# Patient Record
Sex: Female | Born: 1970 | Race: White | Hispanic: No | Marital: Married | Smoking: Current every day smoker
Health system: Southern US, Community
[De-identification: ages and names within clinical notes are randomized; demographics above are authoritative.]

## PROBLEM LIST (undated history)

## (undated) DIAGNOSIS — H3321 Serous retinal detachment, right eye: Secondary | ICD-10-CM

## (undated) DIAGNOSIS — I10 Essential (primary) hypertension: Secondary | ICD-10-CM

## (undated) HISTORY — PX: EYE SURGERY: SHX253

---

## 2004-08-04 ENCOUNTER — Emergency Department (HOSPITAL_COMMUNITY): Admission: EM | Admit: 2004-08-04 | Discharge: 2004-08-04 | Payer: Self-pay | Admitting: Emergency Medicine

## 2004-09-02 ENCOUNTER — Emergency Department (HOSPITAL_COMMUNITY): Admission: EM | Admit: 2004-09-02 | Discharge: 2004-09-02 | Payer: Self-pay | Admitting: Emergency Medicine

## 2004-09-03 ENCOUNTER — Emergency Department (HOSPITAL_COMMUNITY): Admission: EM | Admit: 2004-09-03 | Discharge: 2004-09-03 | Payer: Self-pay | Admitting: Emergency Medicine

## 2004-09-05 ENCOUNTER — Emergency Department (HOSPITAL_COMMUNITY): Admission: EM | Admit: 2004-09-05 | Discharge: 2004-09-05 | Payer: Self-pay | Admitting: Family Medicine

## 2004-12-08 ENCOUNTER — Emergency Department (HOSPITAL_COMMUNITY): Admission: EM | Admit: 2004-12-08 | Discharge: 2004-12-08 | Payer: Self-pay | Admitting: Emergency Medicine

## 2005-02-23 ENCOUNTER — Emergency Department (HOSPITAL_COMMUNITY): Admission: EM | Admit: 2005-02-23 | Discharge: 2005-02-24 | Payer: Self-pay | Admitting: Emergency Medicine

## 2005-02-24 ENCOUNTER — Emergency Department (HOSPITAL_COMMUNITY): Admission: EM | Admit: 2005-02-24 | Discharge: 2005-02-24 | Payer: Self-pay | Admitting: Emergency Medicine

## 2005-04-03 ENCOUNTER — Emergency Department (HOSPITAL_COMMUNITY): Admission: EM | Admit: 2005-04-03 | Discharge: 2005-04-03 | Payer: Self-pay | Admitting: Emergency Medicine

## 2005-04-04 ENCOUNTER — Emergency Department (HOSPITAL_COMMUNITY): Admission: EM | Admit: 2005-04-04 | Discharge: 2005-04-04 | Payer: Self-pay | Admitting: Emergency Medicine

## 2005-05-05 ENCOUNTER — Inpatient Hospital Stay (HOSPITAL_COMMUNITY): Admission: AD | Admit: 2005-05-05 | Discharge: 2005-05-06 | Payer: Self-pay | Admitting: Obstetrics and Gynecology

## 2005-07-12 ENCOUNTER — Emergency Department (HOSPITAL_COMMUNITY): Admission: EM | Admit: 2005-07-12 | Discharge: 2005-07-12 | Payer: Self-pay | Admitting: Emergency Medicine

## 2005-08-09 ENCOUNTER — Emergency Department (HOSPITAL_COMMUNITY): Admission: EM | Admit: 2005-08-09 | Discharge: 2005-08-09 | Payer: Self-pay | Admitting: *Deleted

## 2005-08-10 ENCOUNTER — Emergency Department (HOSPITAL_COMMUNITY): Admission: EM | Admit: 2005-08-10 | Discharge: 2005-08-10 | Payer: Self-pay | Admitting: Emergency Medicine

## 2005-08-13 ENCOUNTER — Inpatient Hospital Stay (HOSPITAL_COMMUNITY): Admission: AD | Admit: 2005-08-13 | Discharge: 2005-08-13 | Payer: Self-pay | Admitting: Obstetrics & Gynecology

## 2005-08-31 ENCOUNTER — Emergency Department (HOSPITAL_COMMUNITY): Admission: EM | Admit: 2005-08-31 | Discharge: 2005-08-31 | Payer: Self-pay | Admitting: Emergency Medicine

## 2005-09-08 ENCOUNTER — Emergency Department (HOSPITAL_COMMUNITY): Admission: EM | Admit: 2005-09-08 | Discharge: 2005-09-08 | Payer: Self-pay | Admitting: Emergency Medicine

## 2005-09-14 ENCOUNTER — Emergency Department (HOSPITAL_COMMUNITY): Admission: EM | Admit: 2005-09-14 | Discharge: 2005-09-14 | Payer: Self-pay | Admitting: Emergency Medicine

## 2006-02-02 ENCOUNTER — Emergency Department (HOSPITAL_COMMUNITY): Admission: EM | Admit: 2006-02-02 | Discharge: 2006-02-02 | Payer: Self-pay | Admitting: Emergency Medicine

## 2006-03-21 ENCOUNTER — Emergency Department (HOSPITAL_COMMUNITY): Admission: EM | Admit: 2006-03-21 | Discharge: 2006-03-22 | Payer: Self-pay | Admitting: Emergency Medicine

## 2006-07-12 ENCOUNTER — Emergency Department (HOSPITAL_COMMUNITY): Admission: EM | Admit: 2006-07-12 | Discharge: 2006-07-12 | Payer: Self-pay | Admitting: Emergency Medicine

## 2006-12-02 ENCOUNTER — Emergency Department (HOSPITAL_COMMUNITY): Admission: EM | Admit: 2006-12-02 | Discharge: 2006-12-03 | Payer: Self-pay | Admitting: Emergency Medicine

## 2007-06-24 IMAGING — CR DG CHEST 2V
2 series · 2 of 2 positions shown · non-contrast
Comparison: 08/31/05.
COMPARISON: None.

CLINICAL DATA: Right chest pain and shortness of breath, post trauma. 
 CHEST ? 2 VIEW:

[w chest pa]
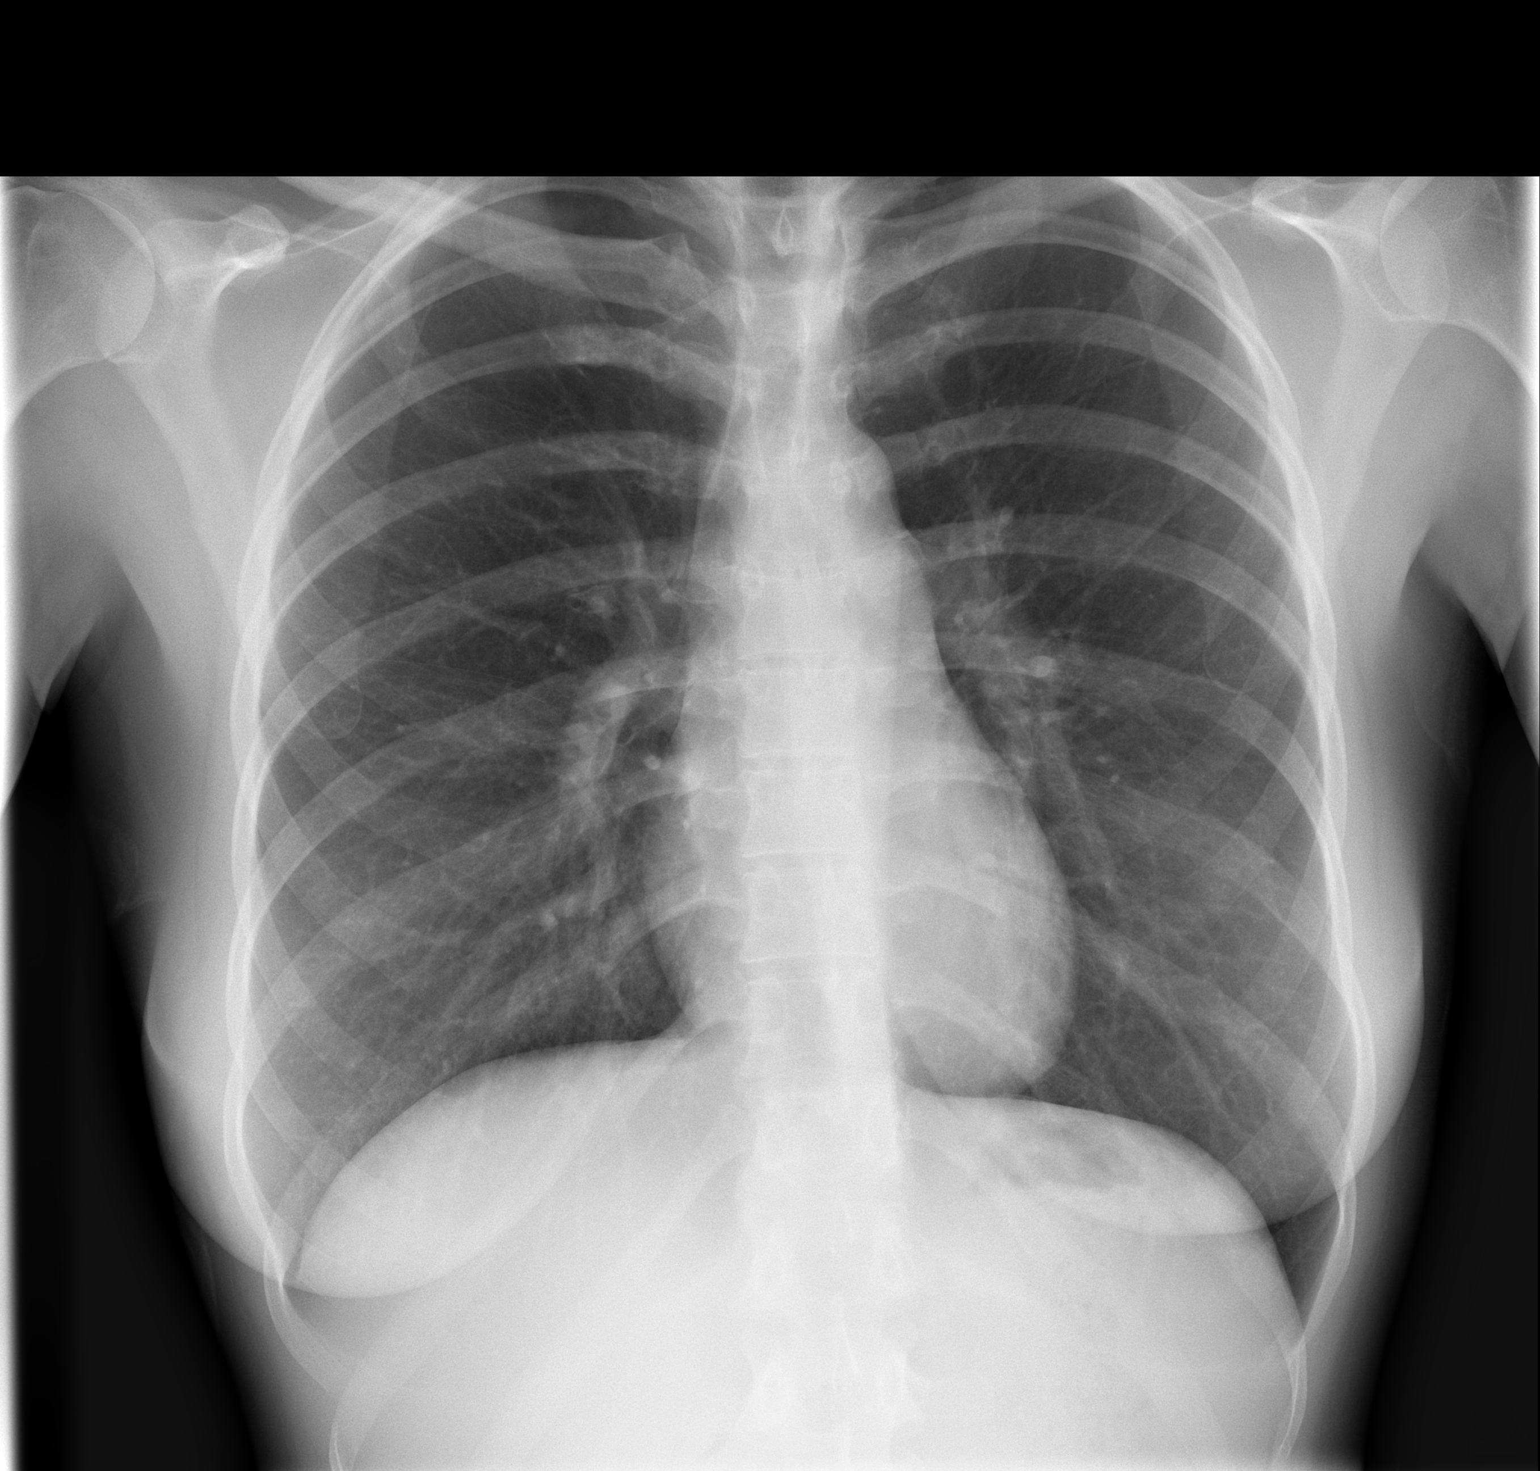

[w chest lat]
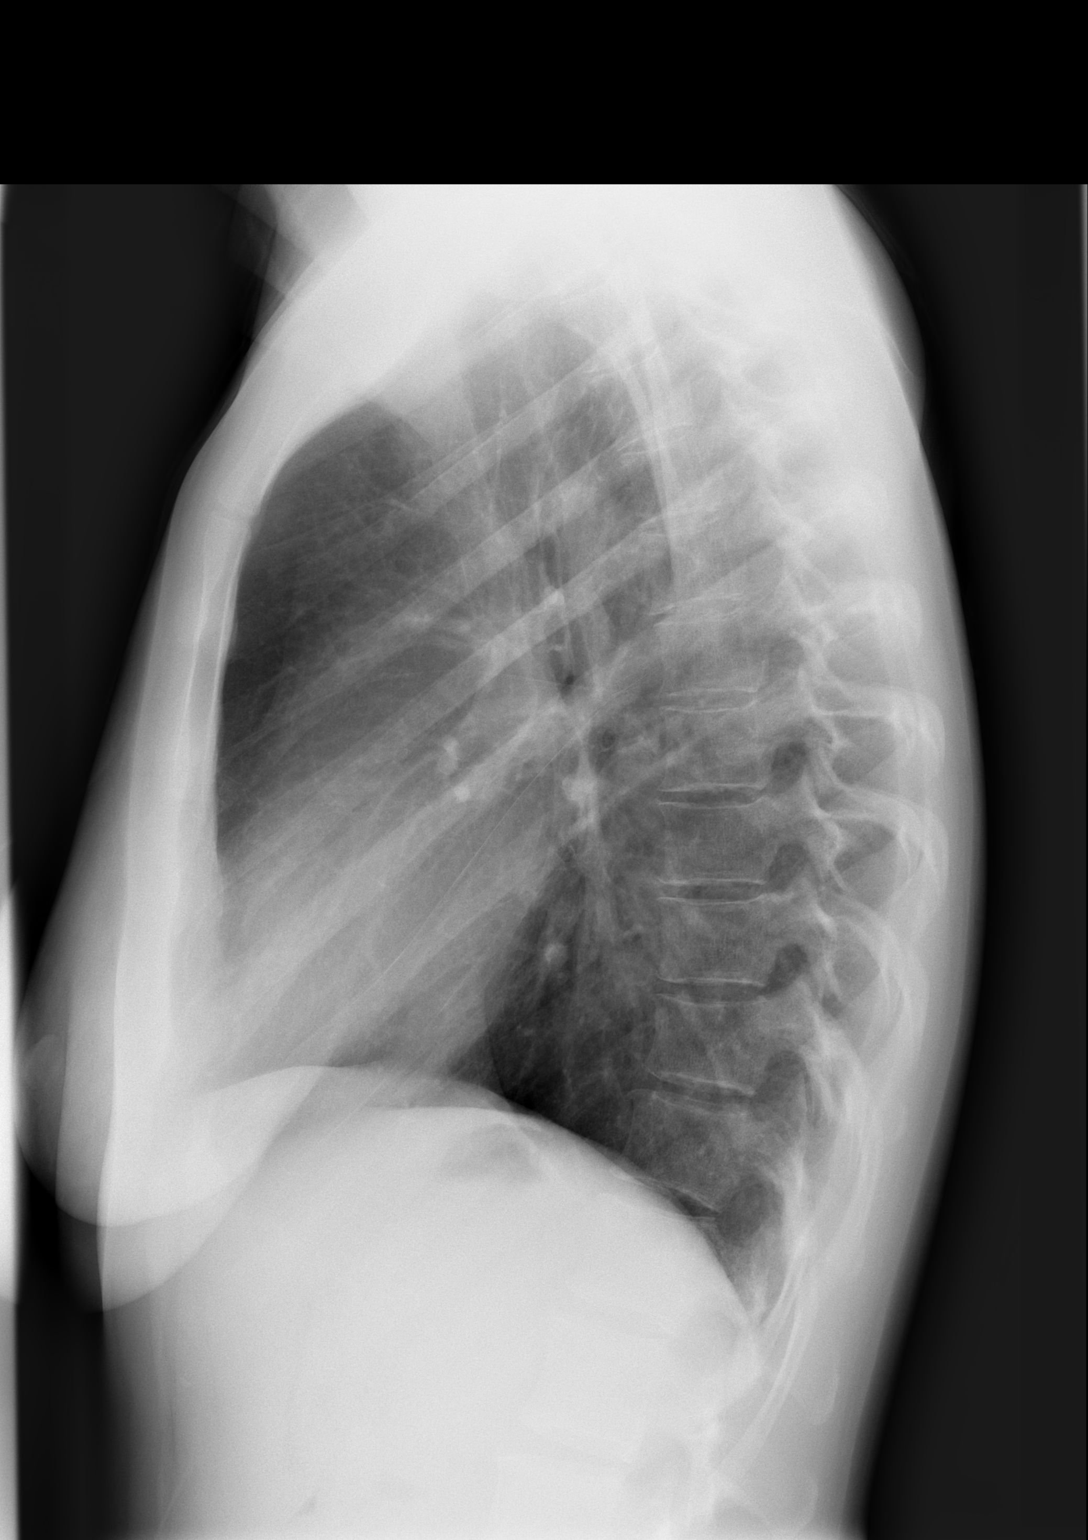

[2 of 2 positions shown; findings below may reference images not displayed]

FINDINGS: The heart size and mediastinal contours are within normal limits.  Both lungs are clear.  The visualized skeletal structures are unremarkable.
IMPRESSION: No active cardiopulmonary disease.
 RIGHT RIBS ? 2 VIEW:
FINDINGS: A BB was placed at the site of symptoms.  No rib fractures.  No pneumothorax or hemothorax.
IMPRESSION: No acute findings.

## 2007-06-24 IMAGING — CR DG RIBS 2V*R*
2 series · 2 of 2 positions shown · non-contrast
Comparison: 08/31/05.
COMPARISON: None.

CLINICAL DATA: Right chest pain and shortness of breath, post trauma. 
 CHEST ? 2 VIEW:

[w ribs ap/pa lower right]
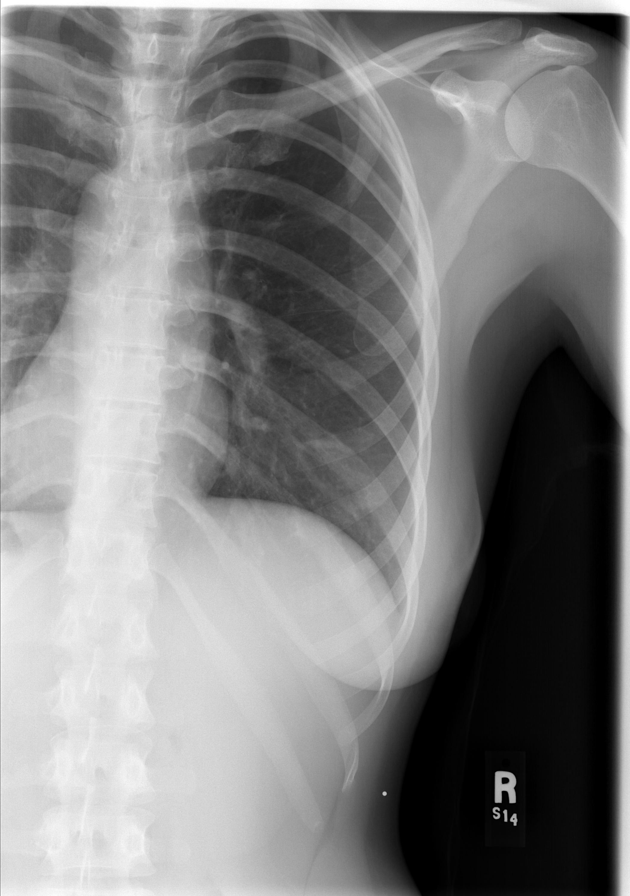

[w ribs oblique right]
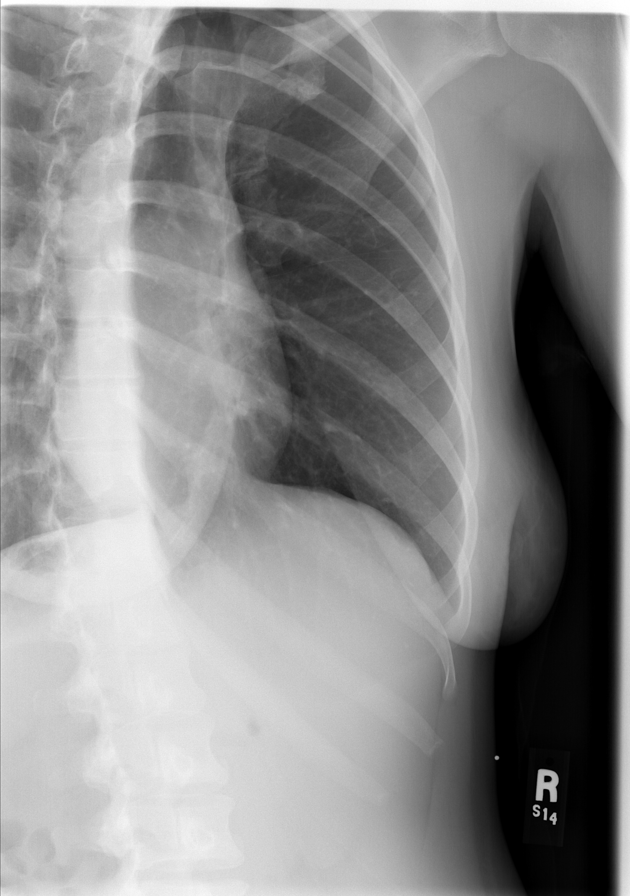

[2 of 2 positions shown; findings below may reference images not displayed]

FINDINGS: The heart size and mediastinal contours are within normal limits.  Both lungs are clear.  The visualized skeletal structures are unremarkable.
IMPRESSION: No active cardiopulmonary disease.
 RIGHT RIBS ? 2 VIEW:
FINDINGS: A BB was placed at the site of symptoms.  No rib fractures.  No pneumothorax or hemothorax.
IMPRESSION: No acute findings.

## 2007-07-25 ENCOUNTER — Ambulatory Visit (HOSPITAL_COMMUNITY): Admission: RE | Admit: 2007-07-25 | Discharge: 2007-07-25 | Payer: Self-pay | Admitting: Ophthalmology

## 2007-08-15 ENCOUNTER — Ambulatory Visit (HOSPITAL_COMMUNITY): Admission: RE | Admit: 2007-08-15 | Discharge: 2007-08-15 | Payer: Self-pay | Admitting: Family Medicine

## 2008-01-16 ENCOUNTER — Ambulatory Visit (HOSPITAL_COMMUNITY): Admission: RE | Admit: 2008-01-16 | Discharge: 2008-01-16 | Payer: Self-pay | Admitting: Ophthalmology

## 2009-03-25 ENCOUNTER — Ambulatory Visit (HOSPITAL_COMMUNITY): Admission: RE | Admit: 2009-03-25 | Discharge: 2009-03-25 | Payer: Self-pay | Admitting: Ophthalmology

## 2009-07-22 ENCOUNTER — Emergency Department (HOSPITAL_COMMUNITY): Admission: EM | Admit: 2009-07-22 | Discharge: 2009-07-23 | Payer: Self-pay | Admitting: Emergency Medicine

## 2010-02-23 ENCOUNTER — Encounter: Payer: Self-pay | Admitting: Obstetrics and Gynecology

## 2010-04-20 LAB — DIFFERENTIAL
Basophils Relative: 0 % (ref 0–1)
Eosinophils Absolute: 0.1 10*3/uL (ref 0.0–0.7)
Lymphocytes Relative: 26 % (ref 12–46)
Monocytes Relative: 8 % (ref 3–12)
Neutrophils Relative %: 65 % (ref 43–77)

## 2010-04-20 LAB — CBC
MCHC: 34.7 g/dL (ref 30.0–36.0)
Platelets: 252 10*3/uL (ref 150–400)
RBC: 4.22 MIL/uL (ref 3.87–5.11)
RDW: 12.4 % (ref 11.5–15.5)

## 2010-04-20 LAB — BASIC METABOLIC PANEL
BUN: 10 mg/dL (ref 6–23)
Calcium: 9.3 mg/dL (ref 8.4–10.5)
Chloride: 101 mEq/L (ref 96–112)

## 2010-04-23 LAB — CBC
HCT: 40.7 % (ref 36.0–46.0)
MCHC: 35.1 g/dL (ref 30.0–36.0)
MCV: 92.4 fL (ref 78.0–100.0)
RDW: 12.8 % (ref 11.5–15.5)

## 2010-04-23 LAB — BASIC METABOLIC PANEL
BUN: 15 mg/dL (ref 6–23)
Calcium: 9.4 mg/dL (ref 8.4–10.5)
Chloride: 103 mEq/L (ref 96–112)
GFR calc Af Amer: >60 mL/min (ref >60)
Glucose, Bld: 88 mg/dL (ref 70–99)
Potassium: 4 mEq/L (ref 3.5–5.1)

## 2010-06-17 NOTE — Op Note (Signed)
Greer, Julie                ACCOUNT NO.:  [id number]   MEDICAL RECORD NO.:  [id number]          PATIENT TYPE:  AMB   LOCATION:  SDS                          FACILITY:  [facility]   PHYSICIAN:  Alford Highland. Rankin, M.D.   DATE OF BIRTH:  [birth year]   DATE OF PROCEDURE:  01/16/2008  DATE OF DISCHARGE:  01/16/2008                               OPERATIVE REPORT   PREOPERATIVE DIAGNOSES:  1. 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  2. Retained oil - nonmagnetic foreign body, right eye.   POSTOPERATIVE DIAGNOSES:  1. 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  2. Retained oil - nonmagnetic foreign body, right eye.   PROCEDURE:  1. Posterior vitrectomy with endolaser panphotocoagulation for retinal      detachment.  2. Removal of nonmagnetic foreign body, right eye.   SURGEON:  Fawn Kirk, MD.   ANESTHESIA:  Local as per my anesthesia control.   INDICATIONS FOR PROCEDURE:  The patient is a 40 year old woman who has  suffered profound vision loss on the basis of chronic retinal  detachment, previously matured cataract which required a complex retinal  detachment repair which was successfully maintained in its retinal  orientated state, and now has retained oil - nonmagnetic foreign body in  the right eye which requires removal.  The patient understands this is  an attempt to maintain retinal re-attachment but also to begin the  process of visual acuity restoration and repair.  The patient  understands the risk of anesthesia including the  rare occurrence of  death, loss of the eye including but not limited to, hemorrhage,  infection, scarring, need for further surgery, change in vision, loss of  vision, progressive disease despite  intervention.   PROCEDURE IN DETAIL:  After appropriate signed consent was obtained, the  patient was taken to the operating room.  In the operating room,  appropriate monitors followed by mild sedation.  Xylocaine 2% 4 mL  injected retrobulbar with an additional 5 mL just directed temporally in  the modified [employer] fashion for superficial anesthesia.  The right  periocular region was sterilely prepped and draped in the usual sterile  fashion.  Lid speculum was applied.  A 25-gauge trocar was placed in the  inferotemporal quadrant.  Infusion was then turned on.  Some small  loculations of oil were found subconjunctival in the inferotemporal as  well as superonasal quadrant.  These were opened with MVR blade and  these were expressed with scleral depression and are removed from the  subconjunctival space without difficulty.   At this time, the superior nasal trocar applied.  Conjunctiva fashioned  supratemporally in limited fashion.  MVR blade used to enter the  vitreous cavity.  At this time, the extraction of silicone wall was then  carried out using an extraction  set with an 18-gauge Silastic catheter.  The oil was removed without difficulty and there were no complications.  At this time, fluid-air exchange carried out on a couple of occasions so  as to remove the smaller bubbles of silicone oil which were retained.  Vitrectomy instrumentation was used for this portion of the technique  and the anterior chamber was also irrigated free of small silicone oil  bubbles.  At this time, the infusion pressure was run up and the trocar  removed.  The supratemporal sclerotomy was then closed with 7-0 Vicryl  sutures.  The infusion pressure was run down and a modest intraocular  pressure elevation was maintained in place because of the typical  hypotony which occurs postoperatively.  The wounds were secured and  subconjunctival Decadron applied.  Sterile patch and Fox shield were  applied.   The patient tolerated the procedure well without any  complication, taken to the PACU.      Alford Highland Rankin, M.D.  Electronically Signed     GAR/MEDQ  D:  01/16/2008  T:  01/17/2008  Job:  [id number]

## 2010-06-17 NOTE — Op Note (Signed)
[name], [name]                ACCOUNT NO.:  [id number]   MEDICAL RECORD NO.:  [id number]          PATIENT TYPE:  AMB   LOCATION:  SDS                          FACILITY:  [facility]   PHYSICIAN:  Alford Highland. Rankin, M.D.   DATE OF BIRTH:  02-24-1970   DATE OF PROCEDURE:  08/15/2007  DATE OF DISCHARGE:                               OPERATIVE REPORT   PREOPERATIVE DIAGNOSES:  1. Proliferative vitreoretinopathy - stage D, open funnel detachment.  2. Tractional detachment, right eye.  3. Combined rhegmatogenous detachment, right eye.  4. 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  5. Cataract.   POSTOPERATIVE DIAGNOSES:  1. Proliferative vitreoretinopathy - stage D, open funnel detachment.  2. Tractional detachment, right eye.  3. Combined rhegmatogenous detachment, right eye.  4. 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  5. Cataract.   PROCEDURE:  1. Repair of complex retinal detachment of the right eye via      vitrectomy with membrane peel - 20 gauge.  We are using endolaser      pan-photocoagulation and retinopexy, as well as scleral buckle      using 240 and 70 elements in addition to pars plana lensectomy,      inferior peripheral iridectomy, and injection of vitreous      substitute - permanent silicone oil 5000 Centistokes.  2. Posterior synechialysis, right eye.   SURGEON:  Alford Highland. Rankin, MD   ANESTHESIA:  General endotracheal anesthesia.   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  She understands she has a total retinal detachment.  She understands the need for the retina to be reattached and the major  surgical intervention required including removal of the cataract so as  to allow for anterior vitreous base dissection,  vitreous skirt trimming.  She understands the risks of anesthesia including occurrence of death,  loss of the eye, including from the underlying condition as well as  surgical repair, including, but not limited to hemorrhage, infection,  scarring, need for further surgery, change in vision, loss of vision,  and progressive disease despite intervention.   PROCEDURE IN DETAIL:  Appropriate signed consent was obtained.  The  patient was taken to the operating room.  In the operating room,  appropriate monitors were followed by mild sedation.  General  endotracheal anesthesia was induced without difficulty.  The right  periocular region was sterilely prepped and draped in the usual sterile  fashion.  Lid speculum was applied.  Conjunctiva pretty much advanced to  360 degrees.  Rectus muscles isolated on 2-0 silk ties.  Incision was  made to place a 240 band to help isolate the vitreous base.  This was  placed and this was secured permanently in each quadrant with 5-0  Mersilene mattress sutures.  This time, the infusion was then secured  3.5 mm posterior to limbus.  Placement of the  vitreous cavity verified  visually.  Superior sclerotomies were then fashioned.  Microscope was  placed in position.  Core vitrectomy was then begun with a BIOM  Visualization System.  Notable findings were of a narrow fundal  detachment posteriorly.  Notable findings of the subretinal bands  peripherally as well as later on one at 2 o'clock straddling the  equator, and also another one at the 6:30 position also near the  equator, but none of these had traction-related issues nor prevented the  retinal reattachment.  This time, it is necessary to perform lensectomy.  Posterior capsule was entered.  The lens was aspirated using vitrectomy  instrumentation alone.  The anterior capsule was then grasped with  forceps and this was removed as well.  Scleral depression was then used  to trim the vitreous base.  This  time, a retinotomy was then fashioned  inferonasal to the optic nerve.  Fluid-fluid exchange carried out and  thick subretinal fluid was removed in this fashion.  Thereafter, as  necessary created inferior-peripheral iridectomy for placement of plain  silicone oil.  This was done without difficulty with surgical  instrumentation.  This time, fluid exchange completed.  All preretinal  and subretinal fluid was removed in this fashion.  Reaccumulated fluid  was removed.  Endolaser photocoagulation placed 360 degrees from the ora  serrata straddling over with the buckle, which had been preplaced and  slightly posterior.  This was carried out without difficulty.  The  retina remained nicely attached.  Reaccumulated fluid was removed.  Retinotomy was then secured with laser retinopexy as well.  This time,  one of the sclerotomy superiorly was closed with 7-0 Vicryl.  A passive  injection of silicone oil was then carried out.  The remaining  sclerotomies were then removed, the infusion removed, and closed as well  with 7-0 Vicryl.  Tenons were copiously irrigated to remove the  silicone.  Conjunctiva was then brought forward and closed in layers to  the limbus and superiorly with 7-0 Vicryl suture.  Bug juice was then  used to irrigate the subconjunctival and the sub-Tenon space.  Subconjunctival Decadron applied.  Sterile patch and Fox shield applied.  The patient tolerated the procedure without complication.      Alford Highland Rankin, M.D.  Electronically Signed     GAR/MEDQ  D:  08/15/2007  T:  08/16/2007  Job:  [id number]   cc:   Susanne Greenhouse, MD

## 2010-10-30 LAB — BASIC METABOLIC PANEL
BUN: 14
CO2: 25
Calcium: 8.9
Chloride: 102
Creatinine, Ser: 0.87
GFR calc Af Amer: >60
GFR calc non Af Amer: >60
Glucose, Bld: 94
Potassium: 3.7
Sodium: 136

## 2010-10-30 LAB — DIFFERENTIAL
Basophils Absolute: 0
Eosinophils Absolute: 0.1
Eosinophils Relative: 1

## 2010-10-30 LAB — SEDIMENTATION RATE: Sed Rate: 13

## 2010-10-30 LAB — CBC
HCT: 41.3
HCT: 39.6
Hemoglobin: 13.7
MCHC: 34.4
MCHC: 34.6
MCV: 87.7
MCV: 87.5
Platelets: 261
RBC: 4.52
RDW: 11.9
WBC: 8.8

## 2010-10-30 LAB — ANA: Anti Nuclear Antibody(ANA): NEGATIVE

## 2010-10-30 LAB — RPR: RPR Ser Ql: NONREACTIVE

## 2010-10-30 LAB — RHEUMATOID FACTOR: Rhuematoid fact SerPl-aCnc: <20

## 2010-11-07 LAB — BASIC METABOLIC PANEL
BUN: 9 mg/dL (ref 6–23)
GFR calc Af Amer: >60 mL/min (ref >60)
Glucose, Bld: 93 mg/dL (ref 70–99)
Sodium: 135 mEq/L (ref 135–145)

## 2010-11-07 LAB — CBC
HCT: 38.5 % (ref 36.0–46.0)
MCHC: 34.2 g/dL (ref 30.0–36.0)
MCV: 89.4 fL (ref 78.0–100.0)
Platelets: 207 10*3/uL (ref 150–400)
RBC: 4.31 MIL/uL (ref 3.87–5.11)
WBC: 7.1 10*3/uL (ref 4.0–10.5)

## 2010-11-12 LAB — I-STAT 8, (EC8 V) (CONVERTED LAB)
Acid-base deficit: 1
BUN: 5 — ABNORMAL LOW
Bicarbonate: 25.1 — ABNORMAL HIGH
Chloride: 107
Glucose, Bld: 80
HCT: 34 — ABNORMAL LOW
Hemoglobin: 11.6 — ABNORMAL LOW
Operator id: 275321
Potassium: 3.8
Sodium: 137
TCO2: 26
pCO2, Ven: 44.3 — ABNORMAL LOW
pH, Ven: 7.361 — ABNORMAL HIGH

## 2010-11-12 LAB — URINALYSIS, ROUTINE W REFLEX MICROSCOPIC
Bilirubin Urine: NEGATIVE
Glucose, UA: NEGATIVE
Ketones, ur: NEGATIVE
Protein, ur: NEGATIVE
pH: 7

## 2010-11-12 LAB — URINE MICROSCOPIC-ADD ON

## 2010-11-12 LAB — DIFFERENTIAL
Basophils Absolute: 0
Basophils Relative: 0
Eosinophils Absolute: 0.1
Eosinophils Relative: 1
Lymphocytes Relative: 21
Lymphs Abs: 3
Monocytes Absolute: 1.1 — ABNORMAL HIGH
Monocytes Relative: 8
Neutro Abs: 10.2 — ABNORMAL HIGH
Neutrophils Relative %: 71

## 2010-11-12 LAB — TYPE AND SCREEN
ABO/RH(D): A POS
Antibody Screen: NEGATIVE

## 2010-11-12 LAB — CBC
HCT: 32 — ABNORMAL LOW
Hemoglobin: 11 — ABNORMAL LOW
MCHC: 34.5
MCV: 90.4
Platelets: 288
RBC: 3.55 — ABNORMAL LOW
RDW: 13.8
WBC: 14.5 — ABNORMAL HIGH

## 2010-11-12 LAB — POCT I-STAT CREATININE
Creatinine, Ser: 0.6
Operator id: 275321

## 2010-11-12 LAB — WET PREP, GENITAL
Clue Cells Wet Prep HPF POC: NONE SEEN
Yeast Wet Prep HPF POC: NONE SEEN

## 2010-11-12 LAB — GC/CHLAMYDIA PROBE AMP, GENITAL
Chlamydia, DNA Probe: POSITIVE — AB
GC Probe Amp, Genital: NEGATIVE

## 2010-11-12 LAB — ABO/RH: ABO/RH(D): A POS

## 2010-11-12 LAB — HCG, QUANTITATIVE, PREGNANCY: hCG, Beta Chain, Quant, S: 14148 — ABNORMAL HIGH

## 2010-11-20 LAB — POCT PREGNANCY, URINE: Preg Test, Ur: POSITIVE

## 2011-08-31 ENCOUNTER — Emergency Department (HOSPITAL_COMMUNITY)
Admission: EM | Admit: 2011-08-31 | Discharge: 2011-08-31 | Disposition: A | Discharge status: Home / Self Care | Payer: Medicaid Other | Attending: Emergency Medicine | Admitting: Emergency Medicine

## 2011-08-31 ENCOUNTER — Encounter (HOSPITAL_COMMUNITY): Payer: Self-pay | Admitting: Emergency Medicine

## 2011-08-31 ENCOUNTER — Emergency Department (HOSPITAL_COMMUNITY): Payer: Medicaid Other

## 2011-08-31 DIAGNOSIS — M25532 Pain in left wrist: Secondary | ICD-10-CM

## 2011-08-31 DIAGNOSIS — F17200 Nicotine dependence, unspecified, uncomplicated: Secondary | ICD-10-CM | POA: Insufficient documentation

## 2011-08-31 DIAGNOSIS — M25539 Pain in unspecified wrist: Secondary | ICD-10-CM | POA: Insufficient documentation

## 2011-08-31 DIAGNOSIS — I10 Essential (primary) hypertension: Secondary | ICD-10-CM | POA: Insufficient documentation

## 2011-08-31 HISTORY — DX: Essential (primary) hypertension: I10

## 2011-08-31 MED ORDER — IBUPROFEN 800 MG PO TABS
800.0000 mg | ORAL_TABLET | Freq: Once | ORAL | Status: AC
  Administered 2011-08-31: 800 mg via ORAL
  Filled 2011-08-31: qty 1

## 2011-08-31 MED ORDER — OXYCODONE-ACETAMINOPHEN 5-325 MG PO TABS: 1.0000 | ORAL_TABLET | ORAL | Status: AC | PRN

## 2011-08-31 MED ORDER — OXYCODONE-ACETAMINOPHEN 5-325 MG PO TABS
1.0000 | ORAL_TABLET | Freq: Once | ORAL | Status: AC
  Administered 2011-08-31: 1 via ORAL
  Filled 2011-08-31: qty 1

## 2011-08-31 NOTE — ED Notes (Signed)
Patient states she woke up yesterday morning with left hand pain. Denies injury.

## 2011-08-31 NOTE — ED Notes (Signed)
Pt c/o pain in left hand and wrist that she first noticed when she woke up this morning. Pt denies injury. Hand and wrist do not appear swollen. Pt states pain becomes worse with movement. Pt describes pain as "pulling".

## 2011-08-31 NOTE — ED Provider Notes (Signed)
History   This chart was scribed for Julie Booze, MD by Melba Coon. The patient was seen in room APA11/APA11 and the patient's care was started at 10:49PM.    CSN: [id number]  Arrival date & time 08/31/11  2147   First MD Initiated Contact with Patient 08/31/11 2245      Chief Complaint  Patient presents with  . Hand Pain    (Consider location/radiation/quality/duration/timing/severity/associated sxs/prior treatment) The history is provided by the patient. No language interpreter was used.   [name] is a 41 y.o. female who presents to the Emergency Department complaining of constant, moderate to severe, left hand pain that radiates up to the wrist with an onset yesterday morning. Pt woke up with the present pain. No known injury or falls to the affected area. Pt rates the severity of the pain when palpation applied 6/10. No HA, fever, neck pain, sore throat, rash, back pain, CP, SOB, abd pain, n/v/d, dysuria, or extremity weakness, numbness, or tingling. No known allergies. No other pertinent medical symptoms.   Past Medical History  Diagnosis Date  . Hypertension     Past Surgical History  Procedure Date  . Eye surgery     History reviewed. No pertinent family history.  History  Substance Use Topics  . Smoking status: Current Everyday Smoker -- 1.0 packs/day  . Smokeless tobacco: Not on file  . Alcohol Use: Yes     occ    OB History    Grav Para Term Preterm Abortions TAB SAB Ect Mult Living                  Review of Systems 10 Systems reviewed and all are negative for acute change except as noted in the HPI.   Allergies  Review of patient's allergies indicates no known allergies.  Home Medications   Current Outpatient Rx  Name Route Sig Dispense Refill  . UNKNOWN TO PATIENT Oral Take 1 tablet by mouth every morning. For blood pressure    . UNKNOWN TO PATIENT Oral Take 1 tablet by mouth every morning. For blood pressure    . UNKNOWN TO PATIENT  Both Eyes Place 1 drop into both eyes daily. Post surgical procedure    . UNKNOWN TO PATIENT Both Eyes Place 1 drop into both eyes daily. Post surgical procedure      BP 118/74  Pulse 93  Temp 98.6 F (37 C) (Oral)  Resp 14  Ht 5\' 8"  (1.727 m)  Wt 193 lb (87.544 kg)  BMI 29.35 kg/m2  SpO2 100%  LMP 08/10/2011  Physical Exam  Nursing note and vitals reviewed. Constitutional: She is oriented to person, place, and time. She appears well-developed and well-nourished. No distress.  HENT:  Head: Normocephalic and atraumatic.  Right Ear: External ear normal.  Left Ear: External ear normal.  Eyes: EOM are normal.  Neck: Normal range of motion. No tracheal deviation present.  Cardiovascular: Normal rate and regular rhythm.   No murmur heard. Pulmonary/Chest: Effort normal and breath sounds normal. No respiratory distress. She has no wheezes.  Abdominal: Soft. There is no tenderness.  Musculoskeletal: Normal range of motion. She exhibits tenderness (Left wrist has mild swelling on radial side with marked TTP throughout radial aspect of left wrist; pain elicited with any passive ROM of left wrist, but no pain elicited with passive ROM of fingers of left hand.). She exhibits no edema.  Neurological: She is alert and oriented to person, place, and time.  NV intact  Skin: Skin is warm and dry. No rash noted.  Psychiatric: She has a normal mood and affect. Her behavior is normal.    ED Course  Procedures (including critical care time)  DIAGNOSTIC STUDIES: Oxygen Saturation is 100% on room air, normal by my interpretation.    COORDINATION OF CARE:  10:54PM - EDMD reviewed imaging results which were negative. EDMD believes this may be inflammatory arthritis. Pt advised to f/u with orthopedist. Pain meds will be Rx. Pt ready for d/c.  Dg Hand Complete Left  08/31/2011  *RADIOLOGY REPORT*  Clinical Data: Left hand pain and swelling.  LEFT HAND - COMPLETE 3+ VIEW  Comparison: None.   Findings: No fracture or dislocation.  No aggressive osseous lesion. Mild ulnar positive variance and small subchondral cysts involving the proximal ulnar margin of the lunate.  IMPRESSION: Mild ulnar positive variance, with small well-marginated subchondral cysts, can be seen with ulnocarpal abutment.  Original Report Authenticated By: Waneta Martins, M.D.   Images viewed by me.  1. Pain in joint of left wrist      MDM  Acute left wrist pain. This appears to be some form of arthritis. It could conceivably be gout, but she does not have a history of gout and wrist to be an unusual joint for her first episode of gout. She will be placed in a wrist splint for comfort and advised to use over-the-counter NSAIDs in anti-inflammatory doses. She's given a prescription for Percocet for pain and is referred to Dr. Hilda Lias for further evaluation.  I personally performed the services described in this documentation, which was scribed in my presence. The recorded information has been reviewed and considered.       Julie Booze, MD 09/01/11 [phone]

## 2011-09-07 MED FILL — Oxycodone w/ Acetaminophen Tab 5-325 MG: ORAL | Qty: 6 | Status: AC

## 2013-06-19 ENCOUNTER — Emergency Department (HOSPITAL_COMMUNITY): Payer: Medicaid Other

## 2013-06-19 ENCOUNTER — Encounter (HOSPITAL_COMMUNITY): Payer: Self-pay | Admitting: Emergency Medicine

## 2013-06-19 ENCOUNTER — Emergency Department (HOSPITAL_COMMUNITY)
Admission: EM | Admit: 2013-06-19 | Discharge: 2013-06-19 | Disposition: A | Discharge status: Home / Self Care | Payer: Medicaid Other | Attending: Emergency Medicine | Admitting: Emergency Medicine

## 2013-06-19 DIAGNOSIS — IMO0001 Reserved for inherently not codable concepts without codable children: Secondary | ICD-10-CM | POA: Insufficient documentation

## 2013-06-19 DIAGNOSIS — R05 Cough: Secondary | ICD-10-CM | POA: Insufficient documentation

## 2013-06-19 DIAGNOSIS — Z79899 Other long term (current) drug therapy: Secondary | ICD-10-CM | POA: Insufficient documentation

## 2013-06-19 DIAGNOSIS — IMO0002 Reserved for concepts with insufficient information to code with codable children: Secondary | ICD-10-CM | POA: Insufficient documentation

## 2013-06-19 DIAGNOSIS — I10 Essential (primary) hypertension: Secondary | ICD-10-CM | POA: Insufficient documentation

## 2013-06-19 DIAGNOSIS — R509 Fever, unspecified: Secondary | ICD-10-CM | POA: Insufficient documentation

## 2013-06-19 DIAGNOSIS — Z791 Long term (current) use of non-steroidal anti-inflammatories (NSAID): Secondary | ICD-10-CM | POA: Insufficient documentation

## 2013-06-19 DIAGNOSIS — F17200 Nicotine dependence, unspecified, uncomplicated: Secondary | ICD-10-CM | POA: Insufficient documentation

## 2013-06-19 DIAGNOSIS — R059 Cough, unspecified: Secondary | ICD-10-CM | POA: Insufficient documentation

## 2013-06-19 MED ORDER — ACETAMINOPHEN 500 MG PO TABS
1000.0000 mg | ORAL_TABLET | Freq: Once | ORAL | Status: AC
  Administered 2013-06-19: 1000 mg via ORAL

## 2013-06-19 MED ORDER — ACETAMINOPHEN 500 MG PO TABS
ORAL_TABLET | ORAL | Status: AC
  Filled 2013-06-19: qty 2

## 2013-06-19 NOTE — ED Provider Notes (Signed)
Medical screening examination/treatment/procedure(s) were performed by non-physician practitioner and as supervising physician I was immediately available for consultation/collaboration.  Homar Weinkauf T Valinda Fedie, MD 06/19/13 2315 

## 2013-06-19 NOTE — Discharge Instructions (Signed)
Fever, Adult A fever is a higher than normal body temperature. In an adult, an oral temperature around 98.6 F (37 C) is considered normal. A temperature of 100.4 F (38 C) or higher is generally considered a fever. Mild or moderate fevers generally have no long-term effects and often do not require treatment. Extreme fever (greater than or equal to 106 F or 41.1 C) can cause seizures. The sweating that may occur with repeated or prolonged fever may cause dehydration. Elderly people can develop confusion during a fever. A measured temperature can vary with:  Age.  Time of day.  Method of measurement (mouth, underarm, rectal, or ear). The fever is confirmed by taking a temperature with a thermometer. Temperatures can be taken different ways. Some methods are accurate and some are not.  An oral temperature is used most commonly. Electronic thermometers are fast and accurate.  An ear temperature will only be accurate if the thermometer is positioned as recommended by the manufacturer.  A rectal temperature is accurate and done for those adults who have a condition where an oral temperature cannot be taken.  An underarm (axillary) temperature is not accurate and not recommended. Fever is a symptom, not a disease.  CAUSES   Infections commonly cause fever.  Some noninfectious causes for fever include:  Some arthritis conditions.  Some thyroid or adrenal gland conditions.  Some immune system conditions.  Some types of cancer.  A medicine reaction.  High doses of certain street drugs such as methamphetamine.  Dehydration.  Exposure to high outside or room temperatures.  Occasionally, the source of a fever cannot be determined. This is sometimes called a "fever of unknown origin" (FUO).  Some situations may lead to a temporary rise in body temperature that may go away on its own. Examples are:  Childbirth.  Surgery.  Intense exercise. HOME CARE INSTRUCTIONS   Take  appropriate medicines for fever. Follow dosing instructions carefully. If you use acetaminophen to reduce the fever, be careful to avoid taking other medicines that also contain acetaminophen. Do not take aspirin for a fever if you are younger than age 19. There is an association with Reye's syndrome. Reye's syndrome is a rare but potentially deadly disease.  If an infection is present and antibiotics have been prescribed, take them as directed. Finish them even if you start to feel better.  Rest as needed.  Maintain an adequate fluid intake. To prevent dehydration during an illness with prolonged or recurrent fever, you may need to drink extra fluid.Drink enough fluids to keep your urine clear or pale yellow.  Sponging or bathing with room temperature water may help reduce body temperature. Do not use ice water or alcohol sponge baths.  Dress comfortably, but do not over-bundle. SEEK MEDICAL CARE IF:   You are unable to keep fluids down.  You develop vomiting or diarrhea.  You are not feeling at least partly better after 3 days.  You develop new symptoms or problems. SEEK IMMEDIATE MEDICAL CARE IF:   You have shortness of breath or trouble breathing.  You develop excessive weakness.  You are dizzy or you faint.  You are extremely thirsty or you are making little or no urine.  You develop new pain that was not there before (such as in the head, neck, chest, back, or abdomen).  You have persistant vomiting and diarrhea for more than 1 to 2 days.  You develop a stiff neck or your eyes become sensitive to light.  You develop a   skin rash.  You have a fever or persistent symptoms for more than 2 to 3 days.  You have a fever and your symptoms suddenly get worse. MAKE SURE YOU:   Understand these instructions.  Will watch your condition.  Will get help right away if you are not doing well or get worse. Document Released: 07/15/2000 Document Revised: 04/13/2011 Document  Reviewed: 11/20/2010 [employer] Patient Information 2014 [employer].  

## 2013-06-19 NOTE — ED Notes (Addendum)
Pt c/o chills/fever/bodyaches since this morning after arriving at work, pt denies N/V/D, + NP cough

## 2013-06-19 NOTE — ED Notes (Addendum)
Fever, body aches. Chills, Headache, leg pain

## 2013-06-19 NOTE — ED Provider Notes (Signed)
CSN: [id number]     Arrival date & time 06/19/13  1726 History  This chart was scribed for Trisha MangleKaren Sophia PA-C working with Toy BakerAnthony T Allen, MD by Ashley JacobsBrittany Andrews, ED scribe. This patient was seen in room APFT24/APFT24 and the patient's care was started at 6:07 PM.   First MD Initiated Contact with Patient 06/19/13 1809     Chief Complaint  Patient presents with  . Fever     (Consider location/radiation/quality/duration/timing/severity/associated sxs/prior Treatment) The history is provided by the patient. No language interpreter was used.    HPI Comments: [name] is a 43 y.o. female who presents to the Emergency Department complaining of unresolved fever since this morning. She has associated chills, mild cough, and body ache. Denies rhinorrhea and sore throat. Denies dysuria. Pt has a fever of 100.7 in the ED.   Past Medical History  Diagnosis Date  . Hypertension    Past Surgical History  Procedure Laterality Date  . Eye surgery     History reviewed. No pertinent family history. History  Substance Use Topics  . Smoking status: Current Every Day Smoker -- 1.00 packs/day  . Smokeless tobacco: Not on file  . Alcohol Use: Yes     Comment: occ   OB History   Grav Para Term Preterm Abortions TAB SAB Ect Mult Living                 Review of Systems  Constitutional: Positive for fever and chills.  HENT: Negative for rhinorrhea and sore throat.   Respiratory: Positive for cough.   Genitourinary: Negative for dysuria.  Musculoskeletal: Positive for myalgias.  All other systems reviewed and are negative.     Allergies  Review of patient's allergies indicates no known allergies.  Home Medications   Prior to Admission medications   Medication Sig Start Date End Date Taking? Authorizing Provider  acetaminophen (TYLENOL) 500 MG tablet Take 500 mg by mouth every 6 (six) hours as needed.   Yes Historical Provider, MD  hydrochlorothiazide (HYDRODIURIL) 25 MG tablet  Take 25 mg by mouth daily.   Yes Historical Provider, MD  ketorolac (ACULAR) 0.5 % ophthalmic solution Place 1 drop into both eyes 2 (two) times daily.   Yes Historical Provider, MD  lisinopril (PRINIVIL,ZESTRIL) 10 MG tablet Take 10 mg by mouth daily.   Yes Historical Provider, MD  lovastatin (MEVACOR) 20 MG tablet Take 20 mg by mouth at bedtime.   Yes Historical Provider, MD  prednisoLONE acetate (PRED FORTE) 1 % ophthalmic suspension Place 1 drop into both eyes 2 (two) times daily.   Yes Historical Provider, MD   BP 153/91  Pulse 116  Temp(Src) 100.7 F (38.2 C) (Oral)  Resp 20  Ht 5\' 1"  (1.549 m)  Wt 178 lb 6 oz (80.91 kg)  BMI 33.72 kg/m2  SpO2 97%  LMP 06/16/2013 Physical Exam  Nursing note and vitals reviewed. Constitutional: She is oriented to person, place, and time. She appears well-developed and well-nourished.  HENT:  Head: Normocephalic.  Eyes: EOM are normal.  Neck: Normal range of motion.  Pulmonary/Chest: Effort normal.  Abdominal: She exhibits no distension.  Musculoskeletal: Normal range of motion.  Neurological: She is alert and oriented to person, place, and time.  Psychiatric: She has a normal mood and affect.    ED Course  Procedures (including critical care time) DIAGNOSTIC STUDIES: Oxygen Saturation is 97% on room air, normal by my interpretation.    COORDINATION OF CARE:  6:10 PM Discussed course  of care with pt which includes chest x-ray and tylenol. Pt understands and agrees.   Labs Review Labs Reviewed - No data to display  Imaging Review Dg Chest 2 View  06/19/2013   CLINICAL DATA:  43 year old female with cough and congestion.  EXAM: CHEST  2 VIEW  COMPARISON:  07/25/2007 and prior chest radiographs  FINDINGS: The cardiomediastinal silhouette is unremarkable. There is no evidence of focal airspace disease, pulmonary edema, suspicious pulmonary nodule/mass, pleural effusion, or pneumothorax. No acute bony abnormalities are identified.   IMPRESSION: No active cardiopulmonary disease.   Electronically Signed   By: Laveda AbbeJeff  Hu M.D.   On: 06/19/2013 18:41     EKG Interpretation None      MDM   Final diagnoses:  Fever   Pt counseled on fever management.   Pt advised to follow up with her MD or return if any problems.   I personally performed the services in this documentation, which was scribed in my presence.  The recorded information has been reviewed and considered.   Barnet PallKaren SofiaPAC.  Lonia SkinnerLeslie K Castro ValleySofia, PA-C 06/19/13 1944

## 2014-04-26 ENCOUNTER — Emergency Department (HOSPITAL_COMMUNITY): Payer: Medicaid Other

## 2014-04-26 ENCOUNTER — Emergency Department (HOSPITAL_COMMUNITY)
Admission: EM | Admit: 2014-04-26 | Discharge: 2014-04-26 | Disposition: A | Discharge status: Home / Self Care | Payer: Medicaid Other | Attending: Emergency Medicine | Admitting: Emergency Medicine

## 2014-04-26 ENCOUNTER — Encounter (HOSPITAL_COMMUNITY): Payer: Self-pay | Admitting: *Deleted

## 2014-04-26 DIAGNOSIS — Z720 Tobacco use: Secondary | ICD-10-CM | POA: Diagnosis not present

## 2014-04-26 DIAGNOSIS — I10 Essential (primary) hypertension: Secondary | ICD-10-CM | POA: Diagnosis not present

## 2014-04-26 DIAGNOSIS — R079 Chest pain, unspecified: Secondary | ICD-10-CM

## 2014-04-26 DIAGNOSIS — Z79899 Other long term (current) drug therapy: Secondary | ICD-10-CM | POA: Diagnosis not present

## 2014-04-26 DIAGNOSIS — R0789 Other chest pain: Secondary | ICD-10-CM | POA: Diagnosis not present

## 2014-04-26 DIAGNOSIS — R51 Headache: Secondary | ICD-10-CM | POA: Diagnosis not present

## 2014-04-26 DIAGNOSIS — R0602 Shortness of breath: Secondary | ICD-10-CM | POA: Diagnosis not present

## 2014-04-26 LAB — BASIC METABOLIC PANEL
Anion gap: 8 (ref 5–15)
BUN: 9 mg/dL (ref 6–23)
CO2: 24 mmol/L (ref 19–32)
Calcium: 8.7 mg/dL (ref 8.4–10.5)
Chloride: 103 mmol/L (ref 96–112)
Creatinine, Ser: 0.92 mg/dL (ref 0.50–1.10)
GFR calc Af Amer: 88 mL/min — ABNORMAL LOW (ref >90)
GFR, EST NON AFRICAN AMERICAN: 76 mL/min — AB (ref >90)
GLUCOSE: 97 mg/dL (ref 70–99)
POTASSIUM: 3.1 mmol/L — AB (ref 3.5–5.1)
Sodium: 135 mmol/L (ref 135–145)

## 2014-04-26 LAB — CBC WITH DIFFERENTIAL/PLATELET
Basophils Absolute: 0 10*3/uL (ref 0.0–0.1)
Basophils Relative: 1 % (ref 0–1)
Eosinophils Absolute: 0.2 10*3/uL (ref 0.0–0.7)
Eosinophils Relative: 2 % (ref 0–5)
HEMATOCRIT: 38.3 % (ref 36.0–46.0)
HEMOGLOBIN: 13.2 g/dL (ref 12.0–15.0)
LYMPHS PCT: 39 % (ref 12–46)
Lymphs Abs: 3.5 10*3/uL (ref 0.7–4.0)
MCH: 31.3 pg (ref 26.0–34.0)
MCHC: 34.5 g/dL (ref 30.0–36.0)
MCV: 90.8 fL (ref 78.0–100.0)
MONO ABS: 0.9 10*3/uL (ref 0.1–1.0)
MONOS PCT: 10 % (ref 3–12)
NEUTROS ABS: 4.3 10*3/uL (ref 1.7–7.7)
Neutrophils Relative %: 48 % (ref 43–77)
Platelets: 213 10*3/uL (ref 150–400)
RBC: 4.22 MIL/uL (ref 3.87–5.11)
RDW: 12.7 % (ref 11.5–15.5)
WBC: 8.9 10*3/uL (ref 4.0–10.5)

## 2014-04-26 LAB — TROPONIN I

## 2014-04-26 MED ORDER — IBUPROFEN 400 MG PO TABS: 400.0000 mg | ORAL_TABLET | Freq: Four times a day (QID) | ORAL | Status: DC | PRN

## 2014-04-26 MED ORDER — KETOROLAC TROMETHAMINE 60 MG/2ML IM SOLN
60.0000 mg | Freq: Once | INTRAMUSCULAR | Status: AC
  Administered 2014-04-26: 60 mg via INTRAMUSCULAR
  Filled 2014-04-26: qty 2

## 2014-04-26 MED ORDER — HYDROCODONE-ACETAMINOPHEN 5-325 MG PO TABS: 1.0000 | ORAL_TABLET | Freq: Four times a day (QID) | ORAL | Status: DC | PRN

## 2014-04-26 NOTE — ED Notes (Signed)
Pt reporting pain in center of chest.  Denies radiation to neck or arm.  Pt denies any nausea or vomiting.  Reports that she has had pain for past couple days, but has gotten worse tonight.  EMS reports 1 SL nitro and 325 mg ASA  given aprox 15 min

## 2014-04-26 NOTE — Discharge Instructions (Signed)
You were seen today for chest pain. Your evaluation has been reassuring. At this time it does not appear to be related to your heart. Given the you are a smoker and had high blood pressure, you need to follow-up with cardiology for possible stress testing.  Your pain may be related to muscle pain in the chest wall. Take ibuprofen for several days to see if that alleviates the pain. See return precautions.  Chest Pain (Nonspecific) It is often hard to give a specific diagnosis for the cause of chest pain. There is always a chance that your pain could be related to something serious, such as a heart attack or a blood clot in the lungs. You need to follow up with your health care provider for further evaluation. CAUSES   Heartburn.  Pneumonia or bronchitis.  Anxiety or stress.  Inflammation around your heart (pericarditis) or lung (pleuritis or pleurisy).  A blood clot in the lung.  A collapsed lung (pneumothorax). It can develop suddenly on its own (spontaneous pneumothorax) or from trauma to the chest.  Shingles infection (herpes zoster virus). The chest wall is composed of bones, muscles, and cartilage. Any of these can be the source of the pain.  The bones can be bruised by injury.  The muscles or cartilage can be strained by coughing or overwork.  The cartilage can be affected by inflammation and become sore (costochondritis). DIAGNOSIS  Lab tests or other studies may be needed to find the cause of your pain. Your health care provider may have you take a test called an ambulatory electrocardiogram (ECG). An ECG records your heartbeat patterns over a 24-hour period. You may also have other tests, such as:  Transthoracic echocardiogram (TTE). During echocardiography, sound waves are used to evaluate how blood flows through your heart.  Transesophageal echocardiogram (TEE).  Cardiac monitoring. This allows your health care provider to monitor your heart rate and rhythm in real  time.  Holter monitor. This is a portable device that records your heartbeat and can help diagnose heart arrhythmias. It allows your health care provider to track your heart activity for several days, if needed.  Stress tests by exercise or by giving medicine that makes the heart beat faster. TREATMENT   Treatment depends on what may be causing your chest pain. Treatment may include:  Acid blockers for heartburn.  Anti-inflammatory medicine.  Pain medicine for inflammatory conditions.  Antibiotics if an infection is present.  You may be advised to change lifestyle habits. This includes stopping smoking and avoiding alcohol, caffeine, and chocolate.  You may be advised to keep your head raised (elevated) when sleeping. This reduces the chance of acid going backward from your stomach into your esophagus. Most of the time, nonspecific chest pain will improve within 2-3 days with rest and mild pain medicine.  HOME CARE INSTRUCTIONS   If antibiotics were prescribed, take them as directed. Finish them even if you start to feel better.  For the next few days, avoid physical activities that bring on chest pain. Continue physical activities as directed.  Do not use any tobacco products, including cigarettes, chewing tobacco, or electronic cigarettes.  Avoid drinking alcohol.  Only take medicine as directed by your health care provider.  Follow your health care provider's suggestions for further testing if your chest pain does not go away.  Keep any follow-up appointments you made. If you do not go to an appointment, you could develop lasting (chronic) problems with pain. If there is any problem keeping  an appointment, call to reschedule. SEEK MEDICAL CARE IF:   Your chest pain does not go away, even after treatment.  You have a rash with blisters on your chest.  You have a fever. SEEK IMMEDIATE MEDICAL CARE IF:   You have increased chest pain or pain that spreads to your arm,  neck, jaw, back, or abdomen.  You have shortness of breath.  You have an increasing cough, or you cough up blood.  You have severe back or abdominal pain.  You feel nauseous or vomit.  You have severe weakness.  You faint.  You have chills. This is an emergency. Do not wait to see if the pain will go away. Get medical help at once. Call your local emergency services (911 in [place] Do not drive yourself to the hospital. MAKE SURE YOU:   Understand these instructions.  Will watch your condition.  Will get help right away if you are not doing well or get worse. Document Released: 10/29/2004 Document Revised: 01/24/2013 Document Reviewed: 08/25/2007 [facility] Patient Information 2015 [employer] [place]. This information is not intended to replace advice given to you by your health care provider. Make sure you discuss any questions you have with your health care provider.

## 2014-04-26 NOTE — ED Provider Notes (Signed)
CSN: [id number]     Arrival date & time 04/26/14  0000 History  This chart was scribed for Shon Baton, MD by Abel Presto, ED Scribe. This patient was seen in room APA05/APA05 and the patient's care was started at 12:08 AM.    Chief Complaint  Patient presents with  . Chest Pain    Patient is a 44 y.o. female presenting with chest pain. The history is provided by the patient. No language interpreter was used.  Chest Pain Associated symptoms: headache and shortness of breath   Associated symptoms: no abdominal pain, no back pain, no cough, no fever, no nausea and not vomiting    HPI Comments: [name] is a 44 y.o. female brought in by ambulance, with PMHx of HTN who presents to the Emergency Department complaining of intermittent 8/10 mid sternal chest pain with onset 2 days ago worsening tonight. Pt described pain as tightness. Pt denies pain radiation. Pt states movement aggravates the pain. She denies pain with deep inspiration. Pt notes associated mild SOB. Pt has not taken any medication for relief. Pt denies recent surgery, leg swelling, and PE/DVT. Pt denies cough, fever, abdominal pain, nausea, and vomiting. EMS gave pt NTG and ASA, she notes associated headache.    History of hypertension and a current smoker. Past Medical History  Diagnosis Date  . Hypertension    Past Surgical History  Procedure Laterality Date  . Eye surgery     History reviewed. No pertinent family history. History  Substance Use Topics  . Smoking status: Current Every Day Smoker -- 1.00 packs/day  . Smokeless tobacco: Not on file  . Alcohol Use: Yes     Comment: occ   OB History    No data available     Review of Systems  Constitutional: Negative for fever.  Respiratory: Positive for chest tightness and shortness of breath. Negative for cough.   Cardiovascular: Positive for chest pain. Negative for leg swelling.  Gastrointestinal: Negative for nausea, vomiting and abdominal pain.   Genitourinary: Negative for dysuria.  Musculoskeletal: Negative for back pain.  Neurological: Positive for headaches.  All other systems reviewed and are negative.     Allergies  Review of patient's allergies indicates no known allergies.  Home Medications   Prior to Admission medications   Medication Sig Start Date End Date Taking? Authorizing Provider  acetaminophen (TYLENOL) 500 MG tablet Take 500 mg by mouth every 6 (six) hours as needed.    Historical Provider, MD  hydrochlorothiazide (HYDRODIURIL) 25 MG tablet Take 25 mg by mouth daily.    Historical Provider, MD  HYDROcodone-acetaminophen (NORCO/VICODIN) 5-325 MG per tablet Take 1 tablet by mouth every 6 (six) hours as needed. 04/26/14   Shon Baton, MD  ibuprofen (ADVIL,MOTRIN) 400 MG tablet Take 1 tablet (400 mg total) by mouth every 6 (six) hours as needed. 04/26/14   Shon Baton, MD  ketorolac (ACULAR) 0.5 % ophthalmic solution Place 1 drop into both eyes 2 (two) times daily.    Historical Provider, MD  lisinopril (PRINIVIL,ZESTRIL) 10 MG tablet Take 10 mg by mouth daily.    Historical Provider, MD  lovastatin (MEVACOR) 20 MG tablet Take 20 mg by mouth at bedtime.    Historical Provider, MD  prednisoLONE acetate (PRED FORTE) 1 % ophthalmic suspension Place 1 drop into both eyes 2 (two) times daily.    Historical Provider, MD   BP 102/65 mmHg  Pulse 75  Temp(Src) 98.4 F (36.9 C) (Oral)  Resp 18  Ht 5\' 8"  (1.727 m)  Wt 180 lb (81.647 kg)  BMI 27.38 kg/m2  SpO2 96%  LMP 03/29/2014 Physical Exam  Constitutional: She is oriented to person, place, and time. She appears well-developed and well-nourished. No distress.  HENT:  Head: Normocephalic and atraumatic.  Neck: Neck supple.  Cardiovascular: Normal rate, regular rhythm and normal heart sounds.   No murmur heard. Pulmonary/Chest: Effort normal and breath sounds normal. No respiratory distress. She has no wheezes. She exhibits tenderness.  Tenderness to  palpation over the anterior chest wall over the sternum  Abdominal: Soft. Bowel sounds are normal. There is no tenderness. There is no rebound.  Musculoskeletal: She exhibits no edema.  Neurological: She is alert and oriented to person, place, and time.  Skin: Skin is warm and dry.  Psychiatric: She has a normal mood and affect.  Nursing note and vitals reviewed.   ED Course  Procedures (including critical care time) DIAGNOSTIC STUDIES: Oxygen Saturation is 96% on room air, normal by my interpretation.    COORDINATION OF CARE: 12:11 AM Discussed treatment plan with patient at beside, the patient agrees with the plan and has no further questions at this time.   Labs Review Labs Reviewed  BASIC METABOLIC PANEL - Abnormal; Notable for the following:    Potassium 3.1 (*)    GFR calc non Af Amer 76 (*)    GFR calc Af Amer 88 (*)    All other components within normal limits  TROPONIN I  CBC WITH DIFFERENTIAL/PLATELET    Imaging Review Dg Chest 2 View  04/26/2014   CLINICAL DATA:  Chest pain.  EXAM: CHEST  2 VIEW  COMPARISON:  06/19/2013  FINDINGS: The cardiomediastinal contours are normal. The lungs are clear. Pulmonary vasculature is normal. No consolidation, pleural effusion, or pneumothorax. No acute osseous abnormalities are seen.  IMPRESSION: No acute pulmonary process.   Electronically Signed   By: Rubye OaksMelanie  Ehinger M.D.   On: 04/26/2014 02:30     EKG Interpretation   Date/Time:  Thursday April 26 2014 00:03:06 EDT Ventricular Rate:  98 PR Interval:  159 QRS Duration: 101 QT Interval:  374 QTC Calculation: 477 R Axis:   78 Text Interpretation:  Sinus rhythm Low voltage, precordial leads Confirmed  by Heloise Gordan  MD, Denni France ([id number]) on 04/26/2014 12:07:08 AM      MDM   Final diagnoses:  Chest pain, unspecified chest pain type   Patient presents with chest pain. Has been ongoing for the last several days. Atypical for ACS. Patient does have risk factors of hypertension  and current smoking. Chest pain is reproducible on exam. Basic labwork obtained. Patient is PERC negative.  EKG is nonischemic. Chest x-ray is reassuring. Basic lab work including troponin is negative. Given onset of pain several days ago and ongoing nature pain, so 1 troponin at this time is sufficient for ER evaluation. Patient was given Toradol with some relief of pain. Her heart score is 2.  The patient is safe for outpatient workup with cardiology. Patient given cardiology referral and pain medication at discharge.  After history, exam, and medical workup I feel the patient has been appropriately medically screened and is safe for discharge home. Pertinent diagnoses were discussed with the patient. Patient was given return precautions.  I personally performed the services described in this documentation, which was scribed in my presence. The recorded information has been reviewed and is accurate.     Shon Batonourtney F Farid Grigorian, MD 04/26/14 [phone]

## 2016-02-07 ENCOUNTER — Encounter (HOSPITAL_COMMUNITY): Payer: Self-pay

## 2016-02-07 ENCOUNTER — Emergency Department (HOSPITAL_COMMUNITY)
Admission: EM | Admit: 2016-02-07 | Discharge: 2016-02-08 | Disposition: A | Discharge status: Home / Self Care | Payer: Medicaid Other | Attending: Emergency Medicine | Admitting: Emergency Medicine

## 2016-02-07 DIAGNOSIS — F17200 Nicotine dependence, unspecified, uncomplicated: Secondary | ICD-10-CM | POA: Diagnosis not present

## 2016-02-07 DIAGNOSIS — Z79899 Other long term (current) drug therapy: Secondary | ICD-10-CM | POA: Diagnosis not present

## 2016-02-07 DIAGNOSIS — J020 Streptococcal pharyngitis: Secondary | ICD-10-CM | POA: Diagnosis not present

## 2016-02-07 DIAGNOSIS — I10 Essential (primary) hypertension: Secondary | ICD-10-CM | POA: Diagnosis not present

## 2016-02-07 DIAGNOSIS — R112 Nausea with vomiting, unspecified: Secondary | ICD-10-CM

## 2016-02-07 NOTE — ED Triage Notes (Signed)
Pt reports vomiting that started this am.  Pt also reports sore throat and swollen neck glands

## 2016-02-08 LAB — COMPREHENSIVE METABOLIC PANEL
ALT: 48 U/L (ref 14–54)
ANION GAP: 11 (ref 5–15)
AST: 35 U/L (ref 15–41)
Albumin: 3.9 g/dL (ref 3.5–5.0)
Alkaline Phosphatase: 67 U/L (ref 38–126)
BILIRUBIN TOTAL: 0.6 mg/dL (ref 0.3–1.2)
BUN: 9 mg/dL (ref 6–20)
CO2: 24 mmol/L (ref 22–32)
Calcium: 9.3 mg/dL (ref 8.9–10.3)
Chloride: 101 mmol/L (ref 101–111)
Creatinine, Ser: 0.88 mg/dL (ref 0.44–1.00)
GFR calc Af Amer: >60 mL/min (ref >60)
Glucose, Bld: 101 mg/dL — ABNORMAL HIGH (ref 65–99)
POTASSIUM: 3.2 mmol/L — AB (ref 3.5–5.1)
Sodium: 136 mmol/L (ref 135–145)
TOTAL PROTEIN: 7.8 g/dL (ref 6.5–8.1)

## 2016-02-08 LAB — CBC WITH DIFFERENTIAL/PLATELET
Basophils Absolute: 0 10*3/uL (ref 0.0–0.1)
Basophils Relative: 0 %
Eosinophils Absolute: 0.1 10*3/uL (ref 0.0–0.7)
Eosinophils Relative: 0 %
HEMATOCRIT: 41 % (ref 36.0–46.0)
Hemoglobin: 13.9 g/dL (ref 12.0–15.0)
LYMPHS PCT: 20 %
Lymphs Abs: 3.2 10*3/uL (ref 0.7–4.0)
MCH: 31.2 pg (ref 26.0–34.0)
MCHC: 33.9 g/dL (ref 30.0–36.0)
MCV: 91.9 fL (ref 78.0–100.0)
MONO ABS: 1.5 10*3/uL — AB (ref 0.1–1.0)
Monocytes Relative: 9 %
NEUTROS ABS: 11.4 10*3/uL — AB (ref 1.7–7.7)
Neutrophils Relative %: 71 %
Platelets: 243 10*3/uL (ref 150–400)
RBC: 4.46 MIL/uL (ref 3.87–5.11)
RDW: 12.9 % (ref 11.5–15.5)
WBC: 16.2 10*3/uL — ABNORMAL HIGH (ref 4.0–10.5)

## 2016-02-08 LAB — URINALYSIS, ROUTINE W REFLEX MICROSCOPIC
Bilirubin Urine: NEGATIVE
GLUCOSE, UA: NEGATIVE mg/dL
Hgb urine dipstick: NEGATIVE
KETONES UR: NEGATIVE mg/dL
LEUKOCYTES UA: NEGATIVE
NITRITE: NEGATIVE
Protein, ur: NEGATIVE mg/dL
Specific Gravity, Urine: 1 — ABNORMAL LOW (ref 1.005–1.030)
pH: 5 (ref 5.0–8.0)

## 2016-02-08 LAB — LIPASE, BLOOD: Lipase: 29 U/L (ref 11–51)

## 2016-02-08 LAB — PREGNANCY, URINE: Preg Test, Ur: NEGATIVE

## 2016-02-08 LAB — RAPID STREP SCREEN (MED CTR MEBANE ONLY): Streptococcus, Group A Screen (Direct): POSITIVE — AB

## 2016-02-08 MED ORDER — PENICILLIN G BENZATHINE 1200000 UNIT/2ML IM SUSP
1.2000 10*6.[IU] | Freq: Once | INTRAMUSCULAR | Status: AC
  Administered 2016-02-08: 1.2 10*6.[IU] via INTRAMUSCULAR
  Filled 2016-02-08: qty 2

## 2016-02-08 MED ORDER — ONDANSETRON HCL 4 MG PO TABS: 4.0000 mg | ORAL_TABLET | Freq: Four times a day (QID) | ORAL | 0 refills | Status: DC

## 2016-02-08 MED ORDER — ONDANSETRON 4 MG PO TBDP
4.0000 mg | ORAL_TABLET | Freq: Once | ORAL | Status: AC
  Administered 2016-02-08: 4 mg via ORAL
  Filled 2016-02-08: qty 1

## 2016-02-08 NOTE — ED Notes (Signed)
Pt works in [employer]- reports N/V since  Yesterday/ with vomiting x 3 today/

## 2016-02-08 NOTE — ED Notes (Signed)
No reaction noted, sipping ginger ale

## 2016-02-08 NOTE — Discharge Instructions (Signed)
Use the nausea medication as prescribed. Followup with your doctor. Return to the ED if you develop new or worsening symptoms.

## 2016-02-08 NOTE — ED Provider Notes (Signed)
[facility]-EMERGENCY DEPT Provider Note   CSN: [id number] Arrival date & time: 02/07/16  2305  By signing my name below, I, Javier Dockerobert Ryan Halas, attest that this documentation has been prepared under the direction and in the presence of Glynn OctaveStephen Chivonne Rascon, MD. Electronically Signed: Javier Dockerobert Ryan Halas, ER Scribe. 09/14/2015. 1:27 AM.   History   Chief Complaint Chief Complaint  Patient presents with  . Emesis   The history is provided by the patient. No language interpreter was used.    HPI Comments: [name] is a 46 y.o. female who presents to the Emergency Department complaining of three events of vomiting since waking at 8am yesterday morning. She ate [employer] immediately before vomiting. She endorses associated fever, nausea, sore throat and mild intermittent cough. She denies CP, trouble breathing or abdominal pain. She denies PMHX of asthma or COPD. She has NKDA. She works at [employer].  Past Medical History:  Diagnosis Date  . Hypertension     There are no active problems to display for this patient.   Past Surgical History:  Procedure Laterality Date  . EYE SURGERY      OB History    No data available       Home Medications    Prior to Admission medications   Medication Sig Start Date End Date Taking? Authorizing Provider  acetaminophen (TYLENOL) 500 MG tablet Take 500 mg by mouth every 6 (six) hours as needed.    Historical Provider, MD  hydrochlorothiazide (HYDRODIURIL) 25 MG tablet Take 25 mg by mouth daily.    Historical Provider, MD  HYDROcodone-acetaminophen (NORCO/VICODIN) 5-325 MG per tablet Take 1 tablet by mouth every 6 (six) hours as needed. 04/26/14   Shon Batonourtney F Horton, MD  ibuprofen (ADVIL,MOTRIN) 400 MG tablet Take 1 tablet (400 mg total) by mouth every 6 (six) hours as needed. 04/26/14   Shon Batonourtney F Horton, MD  ketorolac (ACULAR) 0.5 % ophthalmic solution Place 1 drop into both eyes 2 (two) times daily.    Historical Provider, MD  lisinopril  (PRINIVIL,ZESTRIL) 10 MG tablet Take 10 mg by mouth daily.    Historical Provider, MD  lovastatin (MEVACOR) 20 MG tablet Take 20 mg by mouth at bedtime.    Historical Provider, MD  prednisoLONE acetate (PRED FORTE) 1 % ophthalmic suspension Place 1 drop into both eyes 2 (two) times daily.    Historical Provider, MD    Family History No family history on file.  Social History Social History  Substance Use Topics  . Smoking status: Current Every Day Smoker    Packs/day: 1.00  . Smokeless tobacco: Never Used  . Alcohol use Yes     Comment: occ     Allergies   Patient has no known allergies.   Review of Systems Review of Systems  Constitutional: Positive for appetite change and fever. Negative for chills.  Cardiovascular: Negative for chest pain.  Gastrointestinal: Positive for nausea and vomiting. Negative for abdominal pain.  Neurological: Negative for headaches.  A complete 10 system review of systems was obtained and all systems are negative except as noted in the HPI and PMH.    Physical Exam Updated Vital Signs BP 127/77   Pulse 87   Temp 98.4 F (36.9 C) (Oral)   Resp 20   Ht 5\' 8"  (1.727 m)   Wt 191 lb (86.6 kg)   LMP 01/26/2016   SpO2 99%   BMI 29.04 kg/m   Physical Exam  Constitutional: She is oriented to person, place, and time.  She appears well-developed and well-nourished. No distress.  HENT:  Head: Normocephalic and atraumatic.  Mouth/Throat: Oropharynx is clear and moist. No oropharyngeal exudate.  Hoarse voice. Erythematous oropharynx, no asymmetry, uvula midline.   Eyes: Conjunctivae and EOM are normal. Pupils are equal, round, and reactive to light.  Neck: Normal range of motion. Neck supple.  No meningismus.  Cardiovascular: Normal rate, regular rhythm, normal heart sounds and intact distal pulses.   No murmur heard. Pulmonary/Chest: Effort normal and breath sounds normal. No respiratory distress.  Lungs clear  Abdominal: Soft. There is no  tenderness. There is no rebound and no guarding.  Musculoskeletal: Normal range of motion. She exhibits no edema or tenderness.  Neurological: She is alert and oriented to person, place, and time. No cranial nerve deficit. She exhibits normal muscle tone. Coordination normal.  No ataxia on finger to nose bilaterally. No pronator drift. 5/5 strength throughout. CN 2-12 intact.Equal grip strength. Sensation intact.   Skin: Skin is warm.  Psychiatric: She has a normal mood and affect. Her behavior is normal.  Nursing note and vitals reviewed.    ED Treatments / Results  DIAGNOSTIC STUDIES: Oxygen Saturation is 99% on RA, normal by my interpretation.    COORDINATION OF CARE: 1:27 AM Discussed treatment plan with pt at bedside and pt agreed to plan.  Labs (all labs ordered are listed, but only abnormal results are displayed) Labs Reviewed  RAPID STREP SCREEN (NOT AT [facility]) - Abnormal; Notable for the following:       Result Value   Streptococcus, Group A Screen (Direct) POSITIVE (*)    All other components within normal limits  CBC WITH DIFFERENTIAL/PLATELET - Abnormal; Notable for the following:    WBC 16.2 (*)    Neutro Abs 11.4 (*)    Monocytes Absolute 1.5 (*)    All other components within normal limits  COMPREHENSIVE METABOLIC PANEL - Abnormal; Notable for the following:    Potassium 3.2 (*)    Glucose, Bld 101 (*)    All other components within normal limits  URINALYSIS, ROUTINE W REFLEX MICROSCOPIC - Abnormal; Notable for the following:    Specific Gravity, Urine 1.000 (*)    All other components within normal limits  LIPASE, BLOOD  PREGNANCY, URINE    EKG  EKG Interpretation None       Radiology No results found.  Procedures Procedures (including critical care time)  Medications Ordered in ED Medications - No data to display   Initial Impression / Assessment and Plan / ED Course  I have reviewed the triage vital signs and the nursing notes.  Pertinent  labs & imaging results that were available during my care of the patient were reviewed by me and considered in my medical decision making (see chart for details).  Clinical Course    Patient presents with 3 episodes of vomiting since 8 AM. This started after eating [employer]. No diarrhea. No fever. No urinary symptoms. No abdominal pain. Also has had a sore throat for the past 2 days.  Patient appears well. Her abdomen is soft and nontender. Rapid strep is positive  Patient treated with IM Bicillin. Abdominal lab work is unremarkable other than She does have leukocytosis. Urinalysis is negative. Patient tolerating by mouth in the ED. No abdominal pain.  Low suspicion for cholecystitis or appendicitis at this time. Patient is tolerating by mouth and well-appearing. Abdomen is soft and nontender.  She'll be treated symptomatically. Strep was treated with IM Bicillin. Follow-up with PCP. Return  precautions discussed.  Final Clinical Impressions(s) / ED Diagnoses   Final diagnoses:  Strep pharyngitis  Non-intractable vomiting with nausea, unspecified vomiting type    New Prescriptions New Prescriptions   No medications on file   I personally performed the services described in this documentation, which was scribed in my presence. The recorded information has been reviewed and is accurate.      Glynn Octave, MD 02/08/16 [phone]

## 2016-02-08 NOTE — ED Notes (Signed)
Pt alert & oriented x4, stable gait. Patient given discharge instructions, paperwork & prescription(s). Patient  instructed to stop at the registration desk to finish any additional paperwork. Patient verbalized understanding. Pt left department w/ no further questions. 

## 2016-02-08 NOTE — ED Notes (Signed)
Physician in to assess pt 

## 2016-05-07 ENCOUNTER — Emergency Department (HOSPITAL_COMMUNITY): Payer: Medicaid Other

## 2016-05-07 ENCOUNTER — Encounter (HOSPITAL_COMMUNITY): Payer: Self-pay | Admitting: Emergency Medicine

## 2016-05-07 ENCOUNTER — Emergency Department (HOSPITAL_COMMUNITY)
Admission: EM | Admit: 2016-05-07 | Discharge: 2016-05-07 | Disposition: A | Discharge status: Home / Self Care | Payer: Medicaid Other | Attending: Emergency Medicine | Admitting: Emergency Medicine

## 2016-05-07 DIAGNOSIS — F17200 Nicotine dependence, unspecified, uncomplicated: Secondary | ICD-10-CM | POA: Diagnosis not present

## 2016-05-07 DIAGNOSIS — I10 Essential (primary) hypertension: Secondary | ICD-10-CM | POA: Insufficient documentation

## 2016-05-07 DIAGNOSIS — M79641 Pain in right hand: Secondary | ICD-10-CM | POA: Diagnosis present

## 2016-05-07 DIAGNOSIS — M79644 Pain in right finger(s): Secondary | ICD-10-CM

## 2016-05-07 DIAGNOSIS — L02511 Cutaneous abscess of right hand: Secondary | ICD-10-CM | POA: Insufficient documentation

## 2016-05-07 DIAGNOSIS — Z79899 Other long term (current) drug therapy: Secondary | ICD-10-CM | POA: Insufficient documentation

## 2016-05-07 MED ORDER — IBUPROFEN 600 MG PO TABS: 600.0000 mg | ORAL_TABLET | Freq: Four times a day (QID) | ORAL | 0 refills | Status: DC | PRN

## 2016-05-07 MED ORDER — LIDOCAINE HCL (PF) 2 % IJ SOLN
INTRAMUSCULAR | Status: AC
  Filled 2016-05-07: qty 10

## 2016-05-07 MED ORDER — SULFAMETHOXAZOLE-TRIMETHOPRIM 800-160 MG PO TABS: 1.0000 | ORAL_TABLET | Freq: Two times a day (BID) | ORAL | 0 refills | Status: AC

## 2016-05-07 NOTE — ED Triage Notes (Signed)
Patient c/o wound to right middle finger that happened x1 month ago. Per patient cut finger with kitchen knife and wound will not heal properly despite using peroxide and home remidies. Patient has swelling and redness and unable to bend finger. Unsure of last tetanus vaccination.

## 2016-05-07 NOTE — Discharge Instructions (Signed)
You are being treated for a possible finger infection at this site, but as discussed,  there does not appear to be an abscess at the location.  Soak your finger in warm water twice daily for 15 minutes each.  Call for a recheck as discussed.  Wear the splint for comfort.

## 2016-05-07 NOTE — ED Notes (Signed)
Dressing applied, Patient given discharge instruction, verbalized understand. Patient ambulatory out of the department.  

## 2016-05-09 NOTE — ED Provider Notes (Signed)
[facility]-EMERGENCY DEPT Provider Note   CSN: [id number] Arrival date & time: 05/07/16  1623     History   Chief Complaint Chief Complaint  Patient presents with  . Hand Pain    HPI [name] is a 46 y.o. female presenting with evaluation of consistent pain and swelling at the site of a finger laceration occurring one month ago while slicing onions.  She endorses a rather superficial laceration at the dip joint, right long finger, volar which she treated with hydrogen peroxide and bandages.  She has had persistent pain , swelling and new redness at the site for the past week. She denies numbness or weakness in the finger except as limited by pain. She reports severe pain if the site is bumped, no alleviators.  She is right handed and works in [employer].    HPI  Past Medical History:  Diagnosis Date  . Hypertension     There are no active problems to display for this patient.   Past Surgical History:  Procedure Laterality Date  . EYE SURGERY      OB History    No data available       Home Medications    Prior to Admission medications   Medication Sig Start Date End Date Taking? Authorizing Provider  acetaminophen (TYLENOL) 500 MG tablet Take 500 mg by mouth every 6 (six) hours as needed.    Historical Provider, MD  hydrochlorothiazide (HYDRODIURIL) 25 MG tablet Take 25 mg by mouth daily.    Historical Provider, MD  HYDROcodone-acetaminophen (NORCO/VICODIN) 5-325 MG per tablet Take 1 tablet by mouth every 6 (six) hours as needed. 04/26/14   Shon Baton, MD  ibuprofen (ADVIL,MOTRIN) 600 MG tablet Take 1 tablet (600 mg total) by mouth every 6 (six) hours as needed. 05/07/16   Burgess Amor, PA-C  ketorolac (ACULAR) 0.5 % ophthalmic solution Place 1 drop into both eyes 2 (two) times daily.    Historical Provider, MD  lisinopril (PRINIVIL,ZESTRIL) 10 MG tablet Take 10 mg by mouth daily.    Historical Provider, MD  lovastatin (MEVACOR) 20 MG tablet Take 20 mg by mouth at  bedtime.    Historical Provider, MD  ondansetron (ZOFRAN) 4 MG tablet Take 1 tablet (4 mg total) by mouth every 6 (six) hours. 02/08/16   Glynn Octave, MD  prednisoLONE acetate (PRED FORTE) 1 % ophthalmic suspension Place 1 drop into both eyes 2 (two) times daily.    Historical Provider, MD  sulfamethoxazole-trimethoprim (BACTRIM DS,SEPTRA DS) 800-160 MG tablet Take 1 tablet by mouth 2 (two) times daily. 05/07/16 05/14/16  Burgess Amor, PA-C    Family History History reviewed. No pertinent family history.  Social History Social History  Substance Use Topics  . Smoking status: Current Every Day Smoker    Packs/day: 1.00  . Smokeless tobacco: Never Used  . Alcohol use Yes     Comment: occ     Allergies   Patient has no known allergies.   Review of Systems Review of Systems  Constitutional: Negative for chills and fever.  Respiratory: Negative for shortness of breath and wheezing.   Musculoskeletal: Positive for arthralgias.  Skin: Positive for color change and wound.  Neurological: Negative for numbness.     Physical Exam Updated Vital Signs BP (!) 143/89 (BP Location: Left Arm)   Pulse 96   Temp 98.6 F (37 C) (Oral)   Resp 18   Ht  (1.702 m)   Wt 86.2 kg   LMP  04/09/2016   SpO2 100%   BMI 29.76 kg/m   Physical Exam  Constitutional: She is oriented to person, place, and time. She appears well-developed and well-nourished.  HENT:  Head: Normocephalic.  Cardiovascular: Normal rate.   Pulmonary/Chest: Effort normal.  Musculoskeletal: She exhibits tenderness.       Right hand: She exhibits tenderness and swelling. She exhibits normal capillary refill and no deformity. Normal sensation noted.  Healed laceration right volar dip of long finger but with surrounding callus formation. Small (0.5 cm) raised, fluctuant and erythematous raised lesion mid volar middle phalanx beneath the area of callus.  No red streaking. FROM of digit.  Neurological: She is alert and  oriented to person, place, and time. No sensory deficit.  Skin: Laceration noted.     ED Treatments / Results  Labs (all labs ordered are listed, but only abnormal results are displayed) Labs Reviewed - No data to display  EKG  EKG Interpretation None       Radiology  Results for orders placed or performed during the hospital encounter of 02/07/16  Rapid strep screen  Result Value Ref Range   Streptococcus, Group A Screen (Direct) POSITIVE (A) NEGATIVE  CBC with Differential/Platelet  Result Value Ref Range   WBC 16.2 (H) 4.0 - 10.5 K/uL   RBC 4.46 3.87 - 5.11 MIL/uL   Hemoglobin 13.9 12.0 - 15.0 g/dL   HCT 16.1 09.6 - 04.5 %   MCV 91.9 78.0 - 100.0 fL   MCH 31.2 26.0 - 34.0 pg   MCHC 33.9 30.0 - 36.0 g/dL   RDW 40.9 81.1 - 91.4 %   Platelets 243 150 - 400 K/uL   Neutrophils Relative % 71 %   Neutro Abs 11.4 (H) 1.7 - 7.7 K/uL   Lymphocytes Relative 20 %   Lymphs Abs 3.2 0.7 - 4.0 K/uL   Monocytes Relative 9 %   Monocytes Absolute 1.5 (H) 0.1 - 1.0 K/uL   Eosinophils Relative 0 %   Eosinophils Absolute 0.1 0.0 - 0.7 K/uL   Basophils Relative 0 %   Basophils Absolute 0.0 0.0 - 0.1 K/uL  Comprehensive metabolic panel  Result Value Ref Range   Sodium 136 135 - 145 mmol/L   Potassium 3.2 (L) 3.5 - 5.1 mmol/L   Chloride 101 101 - 111 mmol/L   CO2 24 22 - 32 mmol/L   Glucose, Bld 101 (H) 65 - 99 mg/dL   BUN 9 6 - 20 mg/dL   Creatinine, Ser 7.82 0.44 - 1.00 mg/dL   Calcium 9.3 8.9 - 95.6 mg/dL   Total Protein 7.8 6.5 - 8.1 g/dL   Albumin 3.9 3.5 - 5.0 g/dL   AST 35 15 - 41 U/L   ALT 48 14 - 54 U/L   Alkaline Phosphatase 67 38 - 126 U/L   Total Bilirubin 0.6 0.3 - 1.2 mg/dL   GFR calc non Af Amer >60 >60 mL/min   GFR calc Af Amer >60 >60 mL/min   Anion gap 11 5 - 15  Lipase, blood  Result Value Ref Range   Lipase 29 11 - 51 U/L  Urinalysis, Routine w reflex microscopic  Result Value Ref Range   Color, Urine YELLOW YELLOW   APPearance CLEAR CLEAR    Specific Gravity, Urine 1.000 (L) 1.005 - 1.030   pH 5.0 5.0 - 8.0   Glucose, UA NEGATIVE NEGATIVE mg/dL   Hgb urine dipstick NEGATIVE NEGATIVE   Bilirubin Urine NEGATIVE NEGATIVE   Ketones, ur NEGATIVE  NEGATIVE mg/dL   Protein, ur NEGATIVE NEGATIVE mg/dL   Nitrite NEGATIVE NEGATIVE   Leukocytes, UA NEGATIVE NEGATIVE  Pregnancy, urine  Result Value Ref Range   Preg Test, Ur NEGATIVE NEGATIVE   Dg Finger Middle Right  Result Date: 05/07/2016 CLINICAL DATA:  wound to right middle finger that happened x1 month ago. --- cut middle finger at MIP joint palm side of hand with kitchen knife. swelling and redness and unable to bend finger. EXAM: RIGHT MIDDLE FINGER 2+V COMPARISON:  None. FINDINGS: No fracture. Joints are normally spaced and aligned. There are no areas of bone resorption to suggest osteomyelitis. There is soft tissue swelling predominantly along the ulnar aspect of the mid to proximal finger. No soft tissue air. No radiopaque foreign body. IMPRESSION: 1. No fracture, dislocation or radiopaque foreign body. 2. No evidence of osteomyelitis. Electronically Signed   By: Amie Portland M.D.   On: 05/07/2016 16:52     Procedures .Marland KitchenIncision and Drainage Date/Time: 05/07/2016 7:15 PM Performed by: Burgess Amor Authorized by: Burgess Amor   Consent:    Consent obtained:  Verbal   Consent given by:  Patient   Risks discussed:  Bleeding and incomplete drainage   Alternatives discussed:  No treatment, delayed treatment, observation and referral Location:    Type:  Abscess   Size:  0.5 cm   Location:  Upper extremity   Upper extremity location:  Finger   Finger location:  R long finger Pre-procedure details:    Skin preparation:  Betadine Anesthesia (see MAR for exact dosages):    Anesthesia method: digital block. Procedure details:    Needle aspiration: yes     Needle size:  18 G   Incision depth:  Subcutaneous   Drainage:  Bloody   Drainage amount:  Moderate Comments:     No  purulent pocket found.   (including critical care time)  Medications Ordered in ED Medications - No data to display   Initial Impression / Assessment and Plan / ED Course  I have reviewed the triage vital signs and the nursing notes.  Pertinent labs & imaging results that were available during my care of the patient were reviewed by me and considered in my medical decision making (see chart for details).     ? Traumatic granuloma vs infection, although no abscess found on need aspiration.  She was placed on bactrim, advised warm soaks.  Referral to ortho/hand for f/u care if sx persist or worsen.  Also discussed return here for any worsened sx in interim.   Final Clinical Impressions(s) / ED Diagnoses   Final diagnoses:  Finger pain, right    New Prescriptions Discharge Medication List as of 05/07/2016  7:31 PM    START taking these medications   Details  sulfamethoxazole-trimethoprim (BACTRIM DS,SEPTRA DS) 800-160 MG tablet Take 1 tablet by mouth 2 (two) times daily., Starting Thu 05/07/2016, Until Thu 05/14/2016, Print         Burgess Amor, PA-C 05/09/16 1821    Lavera Guise, MD 05/11/16 [phone]

## 2016-11-11 ENCOUNTER — Ambulatory Visit: Payer: Self-pay | Admitting: Family Medicine

## 2017-03-07 ENCOUNTER — Encounter (HOSPITAL_COMMUNITY): Payer: Self-pay | Admitting: Emergency Medicine

## 2017-03-07 ENCOUNTER — Other Ambulatory Visit: Payer: Self-pay

## 2017-03-07 ENCOUNTER — Emergency Department (HOSPITAL_COMMUNITY)
Admission: EM | Admit: 2017-03-07 | Discharge: 2017-03-07 | Disposition: A | Discharge status: Home / Self Care | Payer: Medicaid Other | Attending: Emergency Medicine | Admitting: Emergency Medicine

## 2017-03-07 DIAGNOSIS — I10 Essential (primary) hypertension: Secondary | ICD-10-CM | POA: Insufficient documentation

## 2017-03-07 DIAGNOSIS — Z79899 Other long term (current) drug therapy: Secondary | ICD-10-CM | POA: Insufficient documentation

## 2017-03-07 DIAGNOSIS — H538 Other visual disturbances: Secondary | ICD-10-CM | POA: Insufficient documentation

## 2017-03-07 DIAGNOSIS — F17210 Nicotine dependence, cigarettes, uncomplicated: Secondary | ICD-10-CM | POA: Insufficient documentation

## 2017-03-07 MED ORDER — FLUORESCEIN SODIUM 1 MG OP STRP
1.0000 | ORAL_STRIP | Freq: Once | OPHTHALMIC | Status: AC
  Administered 2017-03-07: 1 via OPHTHALMIC
  Filled 2017-03-07: qty 1

## 2017-03-07 MED ORDER — TETRACAINE HCL 0.5 % OP SOLN
2.0000 [drp] | Freq: Once | OPHTHALMIC | Status: AC
  Administered 2017-03-07: 2 [drp] via OPHTHALMIC
  Filled 2017-03-07: qty 4

## 2017-03-07 NOTE — ED Triage Notes (Signed)
Eye pain started a week ago. Pt state that within the last 3 days the patient states she started having some cloudiness in her eye. Pt states when her eyes are cloudy she cannot see at all. Pt states right now her eye pain is a level 5 on a scale of 0 to 10. Pt states she uses eye drops called Ketorolac and Prednisone drops. Pt has also been using lubricant eye drops. Pt states she feels like her eyedrops are not helping. She is able to see better after using the eyedrops but then a few minutes later she has the cloudiness.

## 2017-03-07 NOTE — ED Provider Notes (Signed)
[facility] EMERGENCY DEPARTMENT Provider Note   CSN: [id number] Arrival date & time: 03/07/17  0133     History   Chief Complaint Chief Complaint  Patient presents with  . Eye Pain    HPI [name] is a 47 y.o. female.  The history is provided by the patient.  She has history of hypertension and retinal detachment on the right and comes in with one-week history of intermittent fogging of her vision.  When the vision gets found, it may last anywhere from a few hours to all day.  When this occurs, she is almost completely unable to see.  She did try to get into see her eye doctor, but was unable to get an appointment.  She has noted some stinging in her right eye but no other pain.  She has not noticed anything that seems to affect her symptoms.  Past Medical History:  Diagnosis Date  . Hypertension     There are no active problems to display for this patient.   Past Surgical History:  Procedure Laterality Date  . EYE SURGERY      OB History    No data available       Home Medications    Prior to Admission medications   Medication Sig Start Date End Date Taking? Authorizing Provider  acetaminophen (TYLENOL) 500 MG tablet Take 500 mg by mouth every 6 (six) hours as needed.    [provider]  hydrochlorothiazide (HYDRODIURIL) 25 MG tablet Take 25 mg by mouth daily.    [provider]  HYDROcodone-acetaminophen (NORCO/VICODIN) 5-325 MG per tablet Take 1 tablet by mouth every 6 (six) hours as needed. 04/26/14   Horton, Mayer Masker, MD  ibuprofen (ADVIL,MOTRIN) 600 MG tablet Take 1 tablet (600 mg total) by mouth every 6 (six) hours as needed. 05/07/16   Burgess Amor, PA-C  ketorolac (ACULAR) 0.5 % ophthalmic solution Place 1 drop into both eyes 2 (two) times daily.    [provider]  lisinopril (PRINIVIL,ZESTRIL) 10 MG tablet Take 10 mg by mouth daily.    [provider]  lovastatin (MEVACOR) 20 MG tablet Take 20 mg by mouth at bedtime.     [provider]  ondansetron (ZOFRAN) 4 MG tablet Take 1 tablet (4 mg total) by mouth every 6 (six) hours. 02/08/16   Rancour, Jeannett Senior, MD  prednisoLONE acetate (PRED FORTE) 1 % ophthalmic suspension Place 1 drop into both eyes 2 (two) times daily.    [provider]    Family History No family history on file.  Social History Social History   Tobacco Use  . Smoking status: Current Every Day Smoker    Packs/day: 1.00  . Smokeless tobacco: Never Used  Substance Use Topics  . Alcohol use: Yes    Comment: occ  . Drug use: No     Allergies   Patient has no known allergies.   Review of Systems Review of Systems  All other systems reviewed and are negative.    Physical Exam Updated Vital Signs BP 101/85 (BP Location: Right Arm)   Pulse 84   Temp 97.9 F (36.6 C) (Oral)   Resp 18   Ht 5\' 7"  (1.702 m)   Wt 81.6 kg (180 lb)   LMP 03/01/2017   SpO2 99%   BMI 28.19 kg/m   Physical Exam  Nursing note and vitals reviewed.  47 year old female, resting comfortably and in no acute distress. Vital signs are normal. Oxygen saturation  is 99%, which is normal. Head is normocephalic and atraumatic.  Left pupil reacts to light directly and consensually.  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  There is generalized erythema of the conjunctiva of the right eye which patient states is chronic.  EOMI. Oropharynx is clear. Neck is nontender and supple without adenopathy or JVD. Back is nontender and there is no CVA tenderness. Lungs are clear without rales, wheezes, or rhonchi. Chest is nontender. Heart has regular rate and rhythm without murmur. Abdomen is soft, flat, nontender without masses or hepatosplenomegaly and peristalsis is normoactive. Extremities have no cyanosis or edema, full range of motion is present. Skin is warm and dry without rash. Neurologic: Mental status is normal, cranial nerves are intact, there are no motor  or sensory deficits.  ED Treatments / Results   Procedures Procedures (including critical care time)  Medications Ordered in ED Medications  tetracaine (PONTOCAINE) 0.5 % ophthalmic solution 2 drop (2 drops Both Eyes Given 03/07/17 0201)  fluorescein ophthalmic strip 1 strip (1 strip Both Eyes Given 03/07/17 0201)     Initial Impression / Assessment and Plan / ED Course  I have reviewed the triage vital signs and the nursing notes.  Intermittent blurring of vision of uncertain cause.  Possible intermittent partial retinal detachment.  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  No corneal lesions and anterior chambers are clear.  Attempt was made to get eye pressure using Tono-Pen, but I was unable to get reliable readings.  She will be referred to ophthalmology for evaluation in the next 36 hours.  She has seen Dr. Luciana Axeankin in the past.  On review of old records, last visit with Dr. Luciana Axeankin was in 2011.  She is advised to call him in the morning on Monday, February 4 for same-day appointment.  However, if she is unable to get into see him, she is referred to the on-call ophthalmologist with the same instructions.  Final Clinical Impressions(s) / ED Diagnoses   Final diagnoses:  Blurring of visual image of both eyes    ED Discharge Orders    None       Dione BoozeGlick, Danie Diehl, MD 03/07/17 [phone]

## 2018-02-20 IMAGING — DX DG FINGER MIDDLE 2+V*R*
3 series · 3 of 3 positions shown · non-contrast
Comparison: None.

CLINICAL DATA: wound to right middle finger that happened x1 month
ago. --- cut middle finger at MIP joint palm side of hand with
kitchen knife. swelling and redness and unable to bend finger.

EXAM:
RIGHT MIDDLE FINGER 2+V

[finger ap]
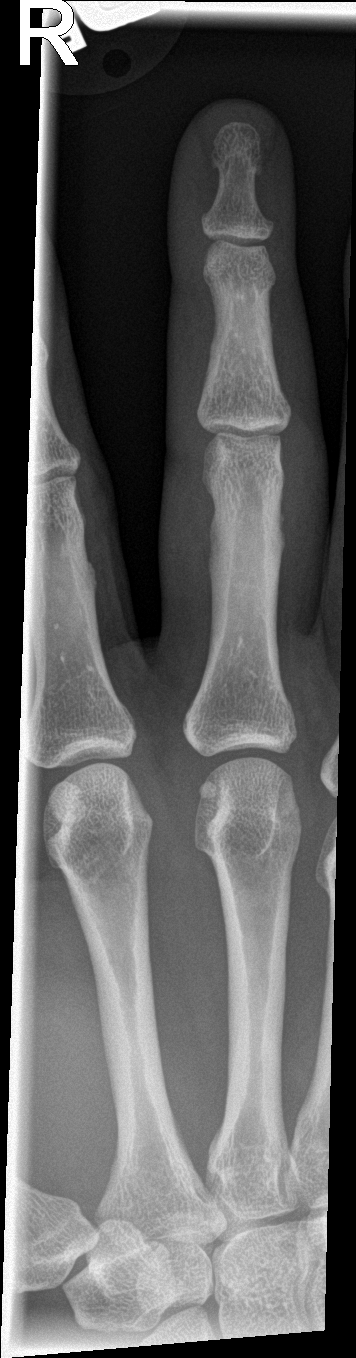

[finger obl]
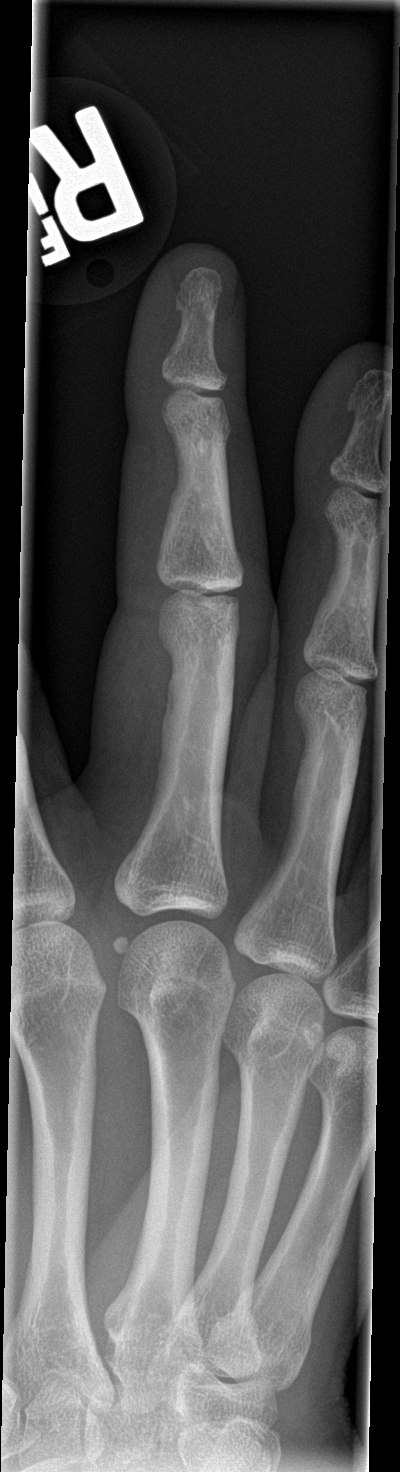

[finger lat]
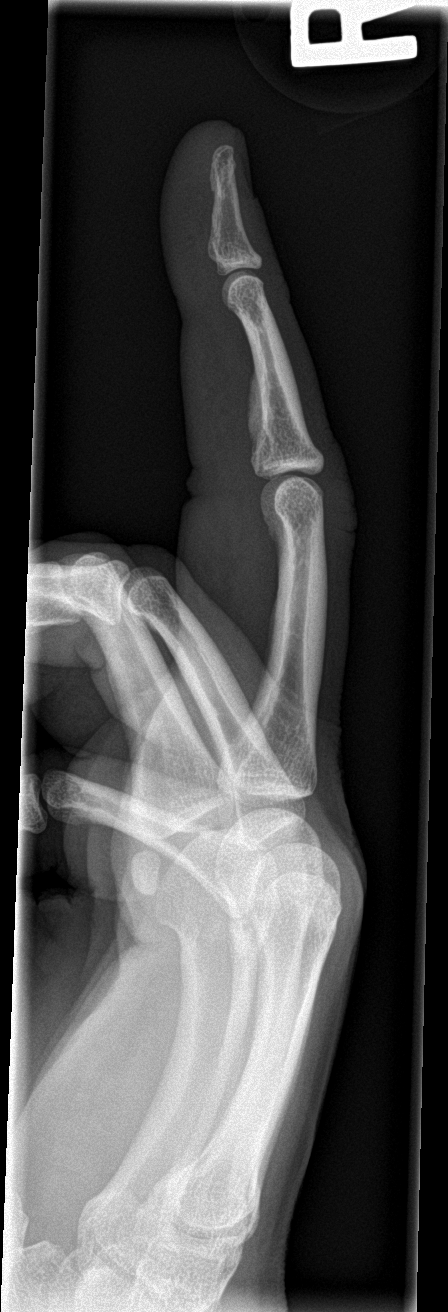

[3 of 3 positions shown; findings below may reference images not displayed]

FINDINGS: No fracture. Joints are normally spaced and aligned. There are no
areas of bone resorption to suggest osteomyelitis.

There is soft tissue swelling predominantly along the ulnar aspect
of the mid to proximal finger. No soft tissue air. No radiopaque
foreign body.
IMPRESSION: 1. No fracture, dislocation or radiopaque foreign body.
2. No evidence of osteomyelitis.

## 2019-01-11 ENCOUNTER — Emergency Department (HOSPITAL_COMMUNITY)
Admission: EM | Admit: 2019-01-11 | Discharge: 2019-01-11 | Disposition: A | Discharge status: Home / Self Care | Payer: Medicaid Other | Attending: Emergency Medicine | Admitting: Emergency Medicine

## 2019-01-11 ENCOUNTER — Encounter (HOSPITAL_COMMUNITY): Payer: Self-pay | Admitting: *Deleted

## 2019-01-11 ENCOUNTER — Other Ambulatory Visit: Payer: Self-pay

## 2019-01-11 DIAGNOSIS — Y9389 Activity, other specified: Secondary | ICD-10-CM | POA: Insufficient documentation

## 2019-01-11 DIAGNOSIS — Y9289 Other specified places as the place of occurrence of the external cause: Secondary | ICD-10-CM | POA: Insufficient documentation

## 2019-01-11 DIAGNOSIS — Y999 Unspecified external cause status: Secondary | ICD-10-CM | POA: Insufficient documentation

## 2019-01-11 DIAGNOSIS — Z79899 Other long term (current) drug therapy: Secondary | ICD-10-CM | POA: Insufficient documentation

## 2019-01-11 DIAGNOSIS — F17210 Nicotine dependence, cigarettes, uncomplicated: Secondary | ICD-10-CM | POA: Insufficient documentation

## 2019-01-11 DIAGNOSIS — S30861A Insect bite (nonvenomous) of abdominal wall, initial encounter: Secondary | ICD-10-CM

## 2019-01-11 DIAGNOSIS — L02211 Cutaneous abscess of abdominal wall: Secondary | ICD-10-CM | POA: Insufficient documentation

## 2019-01-11 DIAGNOSIS — W57XXXA Bitten or stung by nonvenomous insect and other nonvenomous arthropods, initial encounter: Secondary | ICD-10-CM | POA: Insufficient documentation

## 2019-01-11 DIAGNOSIS — I10 Essential (primary) hypertension: Secondary | ICD-10-CM | POA: Insufficient documentation

## 2019-01-11 HISTORY — DX: Serous retinal detachment, right eye: H33.21

## 2019-01-11 MED ORDER — CLINDAMYCIN HCL 150 MG PO CAPS: 150.0000 mg | ORAL_CAPSULE | Freq: Three times a day (TID) | ORAL | 0 refills | Status: DC

## 2019-01-11 NOTE — ED Provider Notes (Signed)
[facility] EMERGENCY DEPARTMENT Provider Note   CSN: [id number] Arrival date & time: 01/11/19  1117     History   Chief Complaint Chief Complaint  Patient presents with   Insect Bite    HPI [name] is a 48 y.o. female.       Patient presents with insect bite to the left lower abdomen.  Not sure exactly which insect.  No fevers chills or vomiting.  No history of MRSA.  Patient is outdoors at the time.  Mild tender to touch.     Past Medical History:  Diagnosis Date   Hypertension    Retinal detachment, right     There are no active problems to display for this patient.   Past Surgical History:  Procedure Laterality Date   EYE SURGERY     RETINAL DETACHMENT SURGERY       OB History   No obstetric history on file.      Home Medications    Prior to Admission medications   Medication Sig Start Date End Date Taking? Authorizing Provider  acetaminophen (TYLENOL) 500 MG tablet Take 500 mg by mouth every 6 (six) hours as needed.    [provider]  clindamycin (CLEOCIN) 150 MG capsule Take 1 capsule (150 mg total) by mouth 3 (three) times daily. 01/11/19   Elnora Morrison, MD  hydrochlorothiazide (HYDRODIURIL) 25 MG tablet Take 25 mg by mouth daily.    [provider]  ibuprofen (ADVIL,MOTRIN) 600 MG tablet Take 1 tablet (600 mg total) by mouth every 6 (six) hours as needed. 05/07/16   Evalee Jefferson, PA-C  ketorolac (ACULAR) 0.5 % ophthalmic solution Place 1 drop into both eyes 2 (two) times daily.    [provider]  lisinopril (PRINIVIL,ZESTRIL) 10 MG tablet Take 10 mg by mouth daily.    [provider]  lovastatin (MEVACOR) 20 MG tablet Take 20 mg by mouth at bedtime.    [provider]  ondansetron (ZOFRAN) 4 MG tablet Take 1 tablet (4 mg total) by mouth every 6 (six) hours. 02/08/16   Rancour, Annie Main, MD  prednisoLONE acetate (PRED FORTE) 1 % ophthalmic suspension Place 1 drop into both eyes 2 (two) times daily.     [provider]    Family History No family history on file.  Social History Social History   Tobacco Use   Smoking status: Current Every Day Smoker    Packs/day: 0.50   Smokeless tobacco: Never Used  Substance Use Topics   Alcohol use: Yes    Comment: occ   Drug use: No     Allergies   Patient has no known allergies.   Review of Systems Review of Systems  Constitutional: Negative for chills and fever.  HENT: Negative for congestion.   Eyes: Negative for visual disturbance.  Respiratory: Negative for shortness of breath.   Cardiovascular: Negative for chest pain.  Gastrointestinal: Negative for abdominal pain and vomiting.  Genitourinary: Negative for dysuria and flank pain.  Musculoskeletal: Negative for back pain, neck pain and neck stiffness.  Skin: Positive for rash.  Neurological: Negative for light-headedness and headaches.     Physical Exam Updated Vital Signs BP (!) 160/90 (BP Location: Right Arm)    Pulse 87    Temp 99.2 F (37.3 C) (Oral)    Resp 18    Ht 5\' 7"  (1.702 m)    Wt 68 kg    LMP 12/10/2018    SpO2 100%    BMI 23.49  kg/m   Physical Exam Vitals signs and nursing note reviewed.  Constitutional:      Appearance: She is well-developed.  HENT:     Head: Normocephalic and atraumatic.  Eyes:     General:        Right eye: No discharge.        Left eye: No discharge.     Conjunctiva/sclera: Conjunctivae normal.  Neck:     Musculoskeletal: Normal range of motion and neck supple.     Trachea: No tracheal deviation.  Cardiovascular:     Rate and Rhythm: Normal rate.  Pulmonary:     Effort: Pulmonary effort is normal.  Abdominal:     General: There is no distension.     Palpations: Abdomen is soft.     Tenderness: There is abdominal tenderness. There is no guarding.     Comments: Patient has focal area of tenderness approximately 1.5 cm with minimal erythema, no significant fluctuance left mid abdomen.  No surrounding warmth or  drainage.  Skin:    General: Skin is warm.     Findings: Rash present.  Neurological:     Mental Status: She is alert and oriented to person, place, and time.      ED Treatments / Results  Labs (all labs ordered are listed, but only abnormal results are displayed) Labs Reviewed - No data to display  EKG None  Radiology No results found.  Procedures .Marland KitchenIncision and Drainage  Date/Time: 01/11/2019 1:03 PM Performed by: Blane Ohara, MD Authorized by: Blane Ohara, MD   Consent:    Consent obtained:  Verbal   Risks discussed:  Bleeding and incomplete drainage Location:    Type:  Abscess   Size:  1 cm   Location:  Trunk   Trunk location:  Abdomen Pre-procedure details:    Skin preparation:  Chloraprep Anesthesia (see MAR for exact dosages):    Anesthesia method:  None Procedure type:    Complexity:  Simple Procedure details:    Needle aspiration: yes     Needle size:  18 G   Packing materials:  None Post-procedure details:    Patient tolerance of procedure:  Tolerated well, no immediate complications   (including critical care time)  Medications Ordered in ED Medications - No data to display   Initial Impression / Assessment and Plan / ED Course  I have reviewed the triage vital signs and the nursing notes.  Pertinent labs & imaging results that were available during my care of the patient were reviewed by me and considered in my medical decision making (see chart for details).       Concern for insect bite versus early MRSA.  18-gauge needle used however no purulence obtained.  Discussed soaks and antibiotics with reasons to return.  Final Clinical Impressions(s) / ED Diagnoses   Final diagnoses:  Insect bite of abdomen, initial encounter    ED Discharge Orders         Ordered    clindamycin (CLEOCIN) 150 MG capsule  3 times daily     01/11/19 1234           Blane Ohara, MD 01/11/19 1303

## 2019-01-11 NOTE — ED Triage Notes (Signed)
Pt c/o swelling, redness, skin changes to left side of abdomen after she was possibly bitten by something while outside hanging Christmas decorations on Sunday or Monday. Pt reports she didn't feel a bite. Has been using Peroxide and Neosporin with no relief.

## 2019-01-11 NOTE — Discharge Instructions (Signed)
This may be MRSA, return for fevers, vomiting, if wound enlarges and is more firm or new concerns. Take antibiotics as discussed and soak regularly in the bath or shower twice a day. Clean bath tub after with household bleach.

## 2020-03-13 ENCOUNTER — Emergency Department (HOSPITAL_COMMUNITY)
Admission: EM | Admit: 2020-03-13 | Discharge: 2020-03-13 | Disposition: A | Discharge status: Home / Self Care | Payer: Medicaid Other | Attending: Emergency Medicine | Admitting: Emergency Medicine

## 2020-03-13 ENCOUNTER — Encounter (HOSPITAL_COMMUNITY): Payer: Self-pay

## 2020-03-13 ENCOUNTER — Other Ambulatory Visit: Payer: Self-pay

## 2020-03-13 DIAGNOSIS — I10 Essential (primary) hypertension: Secondary | ICD-10-CM | POA: Insufficient documentation

## 2020-03-13 DIAGNOSIS — Z79899 Other long term (current) drug therapy: Secondary | ICD-10-CM | POA: Insufficient documentation

## 2020-03-13 DIAGNOSIS — H6690 Otitis media, unspecified, unspecified ear: Secondary | ICD-10-CM

## 2020-03-13 DIAGNOSIS — H6502 Acute serous otitis media, left ear: Secondary | ICD-10-CM | POA: Insufficient documentation

## 2020-03-13 DIAGNOSIS — F17200 Nicotine dependence, unspecified, uncomplicated: Secondary | ICD-10-CM | POA: Insufficient documentation

## 2020-03-13 MED ORDER — AMOXICILLIN 500 MG PO CAPS: 500.0000 mg | ORAL_CAPSULE | Freq: Three times a day (TID) | ORAL | 0 refills | Status: DC

## 2020-03-13 NOTE — ED Triage Notes (Signed)
Left ear pain x5 days. Said it is swollen and hurts to eat. Denies drainage.

## 2020-03-13 NOTE — Discharge Instructions (Signed)
You have an infection of your left ear.  Is important that you take the antibiotic as directed.  You may take Tylenol or ibuprofen if needed for pain and/or fever.  Follow-up with your primary care provider in 1 week for recheck of your ear.

## 2020-03-13 NOTE — ED Provider Notes (Signed)
[facility] EMERGENCY DEPARTMENT Provider Note   CSN: [id number] Arrival date & time: 03/13/20  1826     History Chief Complaint  Patient presents with  . Otalgia    [name] is a 50 y.o. female.  HPI      [name] is a 50 y.o. female who presents to the Emergency Department complaining of left ear pain and fullness x5 days.  She reports swelling inside her ear pain associated with chewing.  She denies decreased hearing, nasal congestion or dental pain.  No dizziness, trauma or headache.  She has been taking ibuprofen with minimal relief.  Pain gradually worsening.  She denies fever, chills, cough, neck pain and sore throat.    Past Medical History:  Diagnosis Date  . Hypertension   . Retinal detachment, right     There are no problems to display for this patient.   Past Surgical History:  Procedure Laterality Date  . EYE SURGERY    . RETINAL DETACHMENT SURGERY       OB History   No obstetric history on file.     History reviewed. No pertinent family history.  Social History   Tobacco Use  . Smoking status: Current Every Day Smoker    Packs/day: 0.50  . Smokeless tobacco: Never Used  Substance Use Topics  . Alcohol use: Yes    Comment: occ  . Drug use: No    Home Medications Prior to Admission medications   Medication Sig Start Date End Date Taking? Authorizing Provider  acetaminophen (TYLENOL) 500 MG tablet Take 500 mg by mouth every 6 (six) hours as needed.    [provider]  clindamycin (CLEOCIN) 150 MG capsule Take 1 capsule (150 mg total) by mouth 3 (three) times daily. 01/11/19   Blane Ohara, MD  hydrochlorothiazide (HYDRODIURIL) 25 MG tablet Take 25 mg by mouth daily.    [provider]  ibuprofen (ADVIL,MOTRIN) 600 MG tablet Take 1 tablet (600 mg total) by mouth every 6 (six) hours as needed. 05/07/16   Burgess Amor, PA-C  ketorolac (ACULAR) 0.5 % ophthalmic solution Place 1 drop into both eyes 2 (two) times daily.     [provider]  lisinopril (PRINIVIL,ZESTRIL) 10 MG tablet Take 10 mg by mouth daily.    [provider]  lovastatin (MEVACOR) 20 MG tablet Take 20 mg by mouth at bedtime.    [provider]  ondansetron (ZOFRAN) 4 MG tablet Take 1 tablet (4 mg total) by mouth every 6 (six) hours. 02/08/16   Rancour, Jeannett Senior, MD  prednisoLONE acetate (PRED FORTE) 1 % ophthalmic suspension Place 1 drop into both eyes 2 (two) times daily.    [provider]    Allergies    Patient has no known allergies.  Review of Systems   Review of Systems  Constitutional: Negative for chills, fatigue and fever.  HENT: Positive for ear pain. Negative for congestion, ear discharge, facial swelling, hearing loss, sinus pain, sore throat and trouble swallowing.   Respiratory: Negative for cough, shortness of breath and wheezing.   Cardiovascular: Negative for chest pain.  Gastrointestinal: Negative for abdominal pain, nausea and vomiting.  Musculoskeletal: Negative for arthralgias, neck pain and neck stiffness.  Skin: Negative for rash.  Neurological: Negative for dizziness, weakness, numbness and headaches.  Hematological: Does not bruise/bleed easily.    Physical Exam Updated Vital Signs BP (!) 145/90 (BP Location: Right Arm)   Pulse 89   Temp 98.5 F (36.9 C)  Resp 18   Ht 5\' 7"  (1.702 m)   Wt 68 kg   LMP 02/22/2020   SpO2 97%   BMI 23.49 kg/m   Physical Exam Vitals and nursing note reviewed.  Constitutional:      General: She is not in acute distress.    Appearance: Normal appearance. She is not ill-appearing.  HENT:     Right Ear: Tympanic membrane and ear canal normal.     Left Ear: External ear normal. No decreased hearing noted. Swelling and tenderness present. No drainage. Tympanic membrane is erythematous. Tympanic membrane is not bulging.     Ears:     Comments: Tenderness and mild erythema of the tragus of the left ear.      Mouth/Throat:     Mouth: Mucous  membranes are moist.     Pharynx: Oropharynx is clear. No oropharyngeal exudate.     Comments: No erythema or edema of the oropharynx.  No exudates.  Uvula is midline nonedematous.  No dental tenderness on exam. Cardiovascular:     Rate and Rhythm: Normal rate and regular rhythm.  Pulmonary:     Effort: Pulmonary effort is normal.     Breath sounds: Normal breath sounds.  Musculoskeletal:     Cervical back: Normal range of motion. No tenderness.  Lymphadenopathy:     Cervical: No cervical adenopathy.  Skin:    General: Skin is warm.     Findings: No rash.  Neurological:     General: No focal deficit present.     Mental Status: She is alert.     ED Results / Procedures / Treatments   Labs (all labs ordered are listed, but only abnormal results are displayed) Labs Reviewed - No data to display  EKG None  Radiology No results found.  Procedures Procedures   Medications Ordered in ED Medications - No data to display  ED Course  I have reviewed the triage vital signs and the nursing notes.  Pertinent labs & imaging results that were available during my care of the patient were reviewed by me and considered in my medical decision making (see chart for details).    MDM Rules/Calculators/A&P                          Patient here with left ear pain x5 days.  On exam, she is well-appearing.  Has left otitis media.  Tenderness of the left tragus, mild erythema.  Erythema of the left TM as well, no bulging or perforation.  Appears appropriate for discharge home, will treat with oral antibiotic and she agrees to outpatient follow-up with PCP.   Final Clinical Impression(s) / ED Diagnoses Final diagnoses:  Acute otitis media, unspecified otitis media type    Rx / DC Orders ED Discharge Orders    None       02/24/2020, PA-C 03/13/20 2103    2104, MD 03/16/20 1326

## 2020-07-18 ENCOUNTER — Emergency Department (HOSPITAL_COMMUNITY)
Admission: EM | Admit: 2020-07-18 | Discharge: 2020-07-18 | Disposition: A | Discharge status: Home / Self Care | Payer: Medicaid Other | Attending: Emergency Medicine | Admitting: Emergency Medicine

## 2020-07-18 ENCOUNTER — Encounter (HOSPITAL_COMMUNITY): Payer: Self-pay | Admitting: Emergency Medicine

## 2020-07-18 ENCOUNTER — Other Ambulatory Visit: Payer: Self-pay

## 2020-07-18 DIAGNOSIS — R42 Dizziness and giddiness: Secondary | ICD-10-CM | POA: Insufficient documentation

## 2020-07-18 DIAGNOSIS — F17210 Nicotine dependence, cigarettes, uncomplicated: Secondary | ICD-10-CM | POA: Insufficient documentation

## 2020-07-18 DIAGNOSIS — R110 Nausea: Secondary | ICD-10-CM | POA: Insufficient documentation

## 2020-07-18 DIAGNOSIS — I10 Essential (primary) hypertension: Secondary | ICD-10-CM | POA: Insufficient documentation

## 2020-07-18 DIAGNOSIS — Z79899 Other long term (current) drug therapy: Secondary | ICD-10-CM | POA: Insufficient documentation

## 2020-07-18 LAB — URINALYSIS, ROUTINE W REFLEX MICROSCOPIC
Bilirubin Urine: NEGATIVE
Glucose, UA: NEGATIVE mg/dL
Ketones, ur: NEGATIVE mg/dL
Nitrite: NEGATIVE
Protein, ur: NEGATIVE mg/dL
Specific Gravity, Urine: 1.002 — ABNORMAL LOW (ref 1.005–1.030)
pH: 6 (ref 5.0–8.0)

## 2020-07-18 LAB — CBC WITH DIFFERENTIAL/PLATELET
Abs Immature Granulocytes: 0.03 10*3/uL (ref 0.00–0.07)
Basophils Absolute: 0.1 10*3/uL (ref 0.0–0.1)
Basophils Relative: 1 %
Eosinophils Absolute: 0.3 10*3/uL (ref 0.0–0.5)
Eosinophils Relative: 4 %
HCT: 43.7 % (ref 36.0–46.0)
Hemoglobin: 14.6 g/dL (ref 12.0–15.0)
Immature Granulocytes: 0 %
Lymphocytes Relative: 47 %
Lymphs Abs: 3.2 10*3/uL (ref 0.7–4.0)
MCH: 30.8 pg (ref 26.0–34.0)
MCHC: 33.4 g/dL (ref 30.0–36.0)
MCV: 92.2 fL (ref 80.0–100.0)
Monocytes Absolute: 0.8 10*3/uL (ref 0.1–1.0)
Monocytes Relative: 12 %
Neutro Abs: 2.4 10*3/uL (ref 1.7–7.7)
Neutrophils Relative %: 36 %
Platelets: 236 10*3/uL (ref 150–400)
RBC: 4.74 MIL/uL (ref 3.87–5.11)
RDW: 12.8 % (ref 11.5–15.5)
WBC: 6.8 10*3/uL (ref 4.0–10.5)
nRBC: 0 % (ref 0.0–0.2)

## 2020-07-18 LAB — COMPREHENSIVE METABOLIC PANEL
ALT: 80 U/L — ABNORMAL HIGH (ref 0–44)
AST: 57 U/L — ABNORMAL HIGH (ref 15–41)
Albumin: 3.8 g/dL (ref 3.5–5.0)
Alkaline Phosphatase: 73 U/L (ref 38–126)
Anion gap: 9 (ref 5–15)
BUN: 10 mg/dL (ref 6–20)
CO2: 24 mmol/L (ref 22–32)
Calcium: 8.7 mg/dL — ABNORMAL LOW (ref 8.9–10.3)
Chloride: 103 mmol/L (ref 98–111)
Creatinine, Ser: 0.83 mg/dL (ref 0.44–1.00)
GFR, Estimated: >60 mL/min (ref >60)
Glucose, Bld: 94 mg/dL (ref 70–99)
Potassium: 3.1 mmol/L — ABNORMAL LOW (ref 3.5–5.1)
Sodium: 136 mmol/L (ref 135–145)
Total Bilirubin: 0.2 mg/dL — ABNORMAL LOW (ref 0.3–1.2)
Total Protein: 7.6 g/dL (ref 6.5–8.1)

## 2020-07-18 LAB — PREGNANCY, URINE: Preg Test, Ur: NEGATIVE

## 2020-07-18 LAB — MAGNESIUM: Magnesium: 2 mg/dL (ref 1.7–2.4)

## 2020-07-18 MED ORDER — LACTATED RINGERS IV BOLUS
1000.0000 mL | Freq: Once | INTRAVENOUS | Status: AC
  Administered 2020-07-18: 03:00:00 1000 mL via INTRAVENOUS

## 2020-07-18 MED ORDER — MECLIZINE HCL 12.5 MG PO TABS
25.0000 mg | ORAL_TABLET | Freq: Once | ORAL | Status: AC
  Administered 2020-07-18: 03:00:00 25 mg via ORAL
  Filled 2020-07-18: qty 2

## 2020-07-18 MED ORDER — PREDNISOLONE ACETATE 1 % OP SUSP: 1.0000 [drp] | Freq: Two times a day (BID) | OPHTHALMIC | 3 refills | Status: DC

## 2020-07-18 MED ORDER — PREDNISONE 20 MG PO TABS: ORAL_TABLET | ORAL | 0 refills | Status: DC

## 2020-07-18 MED ORDER — MECLIZINE HCL 25 MG PO TABS: 25.0000 mg | ORAL_TABLET | Freq: Three times a day (TID) | ORAL | 0 refills | Status: DC | PRN

## 2020-07-18 NOTE — ED Provider Notes (Signed)
[facility] EMERGENCY DEPARTMENT Provider Note   CSN: [id number] Arrival date & time: 07/18/20  0149     History Chief Complaint  Patient presents with   Dizziness    [name] is a 50 y.o. female.   Dizziness Quality:  Head spinning and room spinning Severity:  Mild Onset quality:  Gradual Duration:  2 weeks Timing:  Intermittent Progression:  Waxing and waning Chronicity:  New Context: head movement   Context: not when bending over   Relieved by:  None tried Worsened by:  Nothing Ineffective treatments:  None tried Associated symptoms: nausea   Associated symptoms: no diarrhea, no shortness of breath, no tinnitus, no vision changes and no vomiting       Past Medical History:  Diagnosis Date   Hypertension    Retinal detachment, right     There are no problems to display for this patient.   Past Surgical History:  Procedure Laterality Date   EYE SURGERY     RETINAL DETACHMENT SURGERY       OB History   No obstetric history on file.     No family history on file.  Social History   Tobacco Use   Smoking status: Every Day    Packs/day: 0.50    Pack years: 0.00    Types: Cigarettes   Smokeless tobacco: Never  Substance Use Topics   Alcohol use: Yes    Comment: occ   Drug use: No    Home Medications Prior to Admission medications   Medication Sig Start Date End Date Taking? Authorizing Provider  meclizine (ANTIVERT) 25 MG tablet Take 1 tablet (25 mg total) by mouth 3 (three) times daily as needed for dizziness. 07/18/20  Yes Maxemiliano Riel, Barbara Cower, MD  predniSONE (DELTASONE) 20 MG tablet 3 tabs po daily x 3 days, then 2 tabs x 3 days, then 1.5 tabs x 3 days, then 1 tab x 3 days, then 0.5 tabs x 3 days 07/18/20  Yes Remedios Mckone, Barbara Cower, MD  acetaminophen (TYLENOL) 500 MG tablet Take 500 mg by mouth every 6 (six) hours as needed.    [provider]  amoxicillin (AMOXIL) 500 MG capsule Take 1 capsule (500 mg total) by mouth 3 (three) times daily.  03/13/20   Triplett, Tammy, PA-C  clindamycin (CLEOCIN) 150 MG capsule Take 1 capsule (150 mg total) by mouth 3 (three) times daily. 01/11/19   Blane Ohara, MD  hydrochlorothiazide (HYDRODIURIL) 25 MG tablet Take 25 mg by mouth daily.    [provider]  ibuprofen (ADVIL,MOTRIN) 600 MG tablet Take 1 tablet (600 mg total) by mouth every 6 (six) hours as needed. 05/07/16   Burgess Amor, PA-C  ketorolac (ACULAR) 0.5 % ophthalmic solution Place 1 drop into both eyes 2 (two) times daily.    [provider]  lisinopril (PRINIVIL,ZESTRIL) 10 MG tablet Take 10 mg by mouth daily.    [provider]  lovastatin (MEVACOR) 20 MG tablet Take 20 mg by mouth at bedtime.    [provider]  ondansetron (ZOFRAN) 4 MG tablet Take 1 tablet (4 mg total) by mouth every 6 (six) hours. 02/08/16   Rancour, Jeannett Senior, MD  prednisoLONE acetate (PRED FORTE) 1 % ophthalmic suspension Place 1 drop into both eyes 2 (two) times daily. 07/18/20   Jeffrey Graefe, Barbara Cower, MD    Allergies    Patient has no known allergies.  Review of Systems   Review of Systems  HENT:  Negative for tinnitus.   Respiratory:  Negative  for shortness of breath.   Gastrointestinal:  Positive for nausea. Negative for diarrhea and vomiting.  Neurological:  Positive for dizziness.  All other systems reviewed and are negative.  Physical Exam Updated Vital Signs BP 131/80   Pulse 76   Temp 98.1 F (36.7 C) (Oral)   Resp 19   Ht 5\' 7"  (1.702 m)   Wt 68 kg   LMP 06/10/2020   SpO2 96%   BMI 23.48 kg/m   Physical Exam Vitals and nursing note reviewed.  Constitutional:      Appearance: She is well-developed.  HENT:     Head: Normocephalic and atraumatic.     Nose: Nose normal. No congestion or rhinorrhea.     Mouth/Throat:     Mouth: Mucous membranes are moist.  Eyes:     Pupils: Pupils are equal, round, and reactive to light.  Cardiovascular:     Rate and Rhythm: Normal rate and regular rhythm.  Pulmonary:      Effort: No respiratory distress.     Breath sounds: No stridor.  Abdominal:     General: There is no distension.  Musculoskeletal:     Cervical back: Normal range of motion.  Skin:    General: Skin is warm and dry.  Neurological:     General: No focal deficit present.     Mental Status: She is alert.     Comments: No altered mental status, able to give full seemingly accurate history.  Face is symmetric, EOM's intact, pupils equal and reactive, vision intact, tongue and uvula midline without deviation. Upper and Lower extremity motor 5/5, intact pain perception in distal extremities, 2+ reflexes in biceps, patella and achilles tendons. Able to perform finger to nose normal with both hands. Walks without assistance or evident ataxia.    ED Results / Procedures / Treatments   Labs (all labs ordered are listed, but only abnormal results are displayed) Labs Reviewed  COMPREHENSIVE METABOLIC PANEL - Abnormal; Notable for the following components:      Result Value   Potassium 3.1 (*)    Calcium 8.7 (*)    AST 57 (*)    ALT 80 (*)    Total Bilirubin 0.2 (*)    All other components within normal limits  URINALYSIS, ROUTINE W REFLEX MICROSCOPIC - Abnormal; Notable for the following components:   Color, Urine STRAW (*)    APPearance HAZY (*)    Specific Gravity, Urine 1.002 (*)    Hgb urine dipstick SMALL (*)    Leukocytes,Ua LARGE (*)    Bacteria, UA RARE (*)    All other components within normal limits  CBC WITH DIFFERENTIAL/PLATELET  PREGNANCY, URINE  MAGNESIUM    EKG None  Radiology No results found.  Procedures Procedures   Medications Ordered in ED Medications  lactated ringers bolus 1,000 mL (0 mLs Intravenous Stopped 07/18/20 0437)  meclizine (ANTIVERT) tablet 25 mg (25 mg Oral Given 07/18/20 0303)    ED Course  I have reviewed the triage vital signs and the nursing notes.  Pertinent labs & imaging results that were available during my care of the patient were  reviewed by me and considered in my medical decision making (see chart for details).    MDM Rules/Calculators/A&P                          50 yo F w/ likely vertigo. Will check basic labs and treat for same.   Significant improvement  with meds/fluids. Also with some congestion and questionable sinus issues so will treat with steroid taper in case vestibular neuritis is a possibility.   Final Clinical Impression(s) / ED Diagnoses Final diagnoses:  Vertigo    Rx / DC Orders ED Discharge Orders          Ordered    meclizine (ANTIVERT) 25 MG tablet  3 times daily PRN        07/18/20 0430    predniSONE (DELTASONE) 20 MG tablet        07/18/20 0432    prednisoLONE acetate (PRED FORTE) 1 % ophthalmic suspension  2 times daily        07/18/20 0432             Geneve Kimpel, Barbara Cower, MD 07/18/20 0710

## 2020-07-18 NOTE — ED Notes (Signed)
Ambulates without assist

## 2020-07-18 NOTE — ED Triage Notes (Signed)
Pt c/o dizziness and sweating x 2 weeks.

## 2021-10-21 ENCOUNTER — Emergency Department (HOSPITAL_COMMUNITY)
Admission: EM | Admit: 2021-10-21 | Discharge: 2021-10-21 | Disposition: A | Discharge status: Home / Self Care | Payer: Self-pay | Attending: Emergency Medicine | Admitting: Emergency Medicine

## 2021-10-21 ENCOUNTER — Other Ambulatory Visit: Payer: Self-pay

## 2021-10-21 ENCOUNTER — Encounter (HOSPITAL_COMMUNITY): Payer: Self-pay

## 2021-10-21 DIAGNOSIS — S0502XA Injury of conjunctiva and corneal abrasion without foreign body, left eye, initial encounter: Secondary | ICD-10-CM

## 2021-10-21 DIAGNOSIS — X58XXXA Exposure to other specified factors, initial encounter: Secondary | ICD-10-CM | POA: Insufficient documentation

## 2021-10-21 DIAGNOSIS — Z79899 Other long term (current) drug therapy: Secondary | ICD-10-CM | POA: Insufficient documentation

## 2021-10-21 MED ORDER — OFLOXACIN 0.3 % OP SOLN: 1.0000 [drp] | OPHTHALMIC | 0 refills | Status: AC

## 2021-10-21 MED ORDER — FLUORESCEIN SODIUM 1 MG OP STRP
1.0000 | ORAL_STRIP | Freq: Once | OPHTHALMIC | Status: AC
  Administered 2021-10-21: 1 via OPHTHALMIC
  Filled 2021-10-21: qty 1

## 2021-10-21 MED ORDER — PREDNISOLONE ACETATE 1 % OP SUSP: 1.0000 [drp] | Freq: Four times a day (QID) | OPHTHALMIC | 3 refills | Status: DC

## 2021-10-21 MED ORDER — TETRACAINE HCL 0.5 % OP SOLN
1.0000 [drp] | Freq: Once | OPHTHALMIC | Status: AC
  Administered 2021-10-21: 1 [drp] via OPHTHALMIC
  Filled 2021-10-21: qty 4

## 2021-10-21 NOTE — Discharge Instructions (Signed)
Have spoken with the eye doctors.  They would like for you to be seen in the office this week.  Please call the office tomorrow morning and hopefully you can be seen before Friday.  I have refilled the prescription for your prednisolone and have also given you a prescription for a topical antibiotic in case you have a slight scratch on your left eye.  Please use these exactly as prescribed, ER for severe worsening symptoms  Call the office of Dr. Manuella Ghazi first thing in the morning to make an appointment

## 2021-10-21 NOTE — ED Triage Notes (Signed)
Pt presents to ED, states she feels the lens she has in her right eye is loose x 2 days.

## 2021-10-21 NOTE — ED Provider Notes (Signed)
[facility] EMERGENCY DEPARTMENT Provider Note   CSN: [id number] Arrival date & time: 10/21/21  1720     History  Chief Complaint  Patient presents with   Eye Problem    [name] is a 51 y.o. female.   Eye Problem Associated symptoms: no discharge and no photophobia      This patient is a 51 year old female, she unfortunately has a history of some chronic eye problems, according to the medical record and a consultation in the chart from November 2020 this patient was diagnosed with uveitis, status post CE IOL  OU with Dr. Zadie Rhine and has been taking prednisolone acetate ophthalmic suspension twice a day until she recently ran out.  In the last couple of days she has noted some intermittent blurry vision in her left eye, it is not seeming to bother her right now.  She also feels like her right eye is a little bit uncomfortable and states she has had a replacement of her lens in that eye.  Home Medications Prior to Admission medications   Medication Sig Start Date End Date Taking? Authorizing Provider  ofloxacin (OCUFLOX) 0.3 % ophthalmic solution Place 1 drop into both eyes every 4 (four) hours. 10/21/21  Yes Noemi Chapel, MD  acetaminophen (TYLENOL) 500 MG tablet Take 500 mg by mouth every 6 (six) hours as needed.    [provider]  hydrochlorothiazide (HYDRODIURIL) 25 MG tablet Take 25 mg by mouth daily.    [provider]  lisinopril (PRINIVIL,ZESTRIL) 10 MG tablet Take 10 mg by mouth daily.    [provider]  lovastatin (MEVACOR) 20 MG tablet Take 20 mg by mouth at bedtime.    [provider]  prednisoLONE acetate (PRED FORTE) 1 % ophthalmic suspension Place 1 drop into both eyes 4 (four) times daily. 10/21/21   Noemi Chapel, MD      Allergies    Patient has no known allergies.    Review of Systems   Review of Systems  Eyes:  Positive for pain. Negative for photophobia and discharge.    Physical Exam Updated Vital Signs BP (!)  157/91 (BP Location: Right Arm)   Pulse 96   Temp 98.4 F (36.9 C) (Oral)   Resp 18   Ht 1.727 m (5\' 8" )   Wt 68 kg   SpO2 96%   BMI 22.81 kg/m  Physical Exam Vitals and nursing note reviewed.  Constitutional:      Appearance: She is well-developed. She is not diaphoretic.  HENT:     Head: Normocephalic and atraumatic.  Eyes:     General:        Right eye: No discharge.        Left eye: No discharge.     Conjunctiva/sclera: Conjunctivae normal.     Comments: Abnormally shaped pupil on the right, normal-appearing pupil on the left, no iritis is visualized, sclera are clear, corneas appear clear without any haziness or smoky appearance.  There is no hyphema, there is no periorbital redness or swelling, no eyelid swelling, hypopyon not witnessed  Pulmonary:     Effort: Pulmonary effort is normal. No respiratory distress.  Musculoskeletal:     Right lower leg: No edema.     Left lower leg: No edema.  Skin:    General: Skin is warm and dry.     Findings: No erythema or rash.  Neurological:     General: No focal deficit present.     Mental Status: She is alert.  Coordination: Coordination normal.     ED Results / Procedures / Treatments   Labs (all labs ordered are listed, but only abnormal results are displayed) Labs Reviewed - No data to display  EKG None  Radiology No results found.  Procedures Procedures    Medications Ordered in ED Medications  tetracaine (PONTOCAINE) 0.5 % ophthalmic solution 1 drop (1 drop Both Eyes Given 10/21/21 1950)  fluorescein ophthalmic strip 1 strip (1 strip Both Eyes Given 10/21/21 1950)    ED Course/ Medical Decision Making/ A&P                           Medical Decision Making Risk Prescription drug management.   Patient reports normal gross visual acuity at this time No acute findings to suggest acute emergency We will discuss with ophthalmology as far as follow-up and potential refills on prednisone Patient reports  she made an appointment to follow-up with [facility] ophthalmology today but has not been able to be seen  Tetracaine and fluorescein exam consistent with a small corneal abrasion to the left mid cornea, seems linear, there is no branching or dendritic lesions.  The right eye has no signs of lesions at all under fluorescein exam.  According to the ophthalmologist I spoke with her baseline vision is 20/30 in the left eye and 20/800 in the right eye.  This is consistent with what we hod here today.  Ophthalmology will see the patient in the next couple of days and do recommend a refill on the prednisolone, will add ofloxacin for possible corneal abrasion on the left.  The patient expressed her understanding for the need for close follow-up this week        Final Clinical Impression(s) / ED Diagnoses Final diagnoses:  Corneal abrasion, left, initial encounter    Rx / DC Orders ED Discharge Orders          Ordered    ofloxacin (OCUFLOX) 0.3 % ophthalmic solution  Every 4 hours        10/21/21 1954    prednisoLONE acetate (PRED FORTE) 1 % ophthalmic suspension  4 times daily        10/21/21 1954              Eber Hong, MD 10/21/21 1955

## 2022-02-11 ENCOUNTER — Emergency Department (HOSPITAL_COMMUNITY)
Admission: EM | Admit: 2022-02-11 | Discharge: 2022-02-12 | Disposition: A | Discharge status: Home / Self Care | Payer: Medicaid Other | Attending: Emergency Medicine | Admitting: Emergency Medicine

## 2022-02-11 ENCOUNTER — Other Ambulatory Visit: Payer: Self-pay

## 2022-02-11 ENCOUNTER — Encounter (HOSPITAL_COMMUNITY): Payer: Self-pay | Admitting: *Deleted

## 2022-02-11 DIAGNOSIS — H5711 Ocular pain, right eye: Secondary | ICD-10-CM

## 2022-02-11 NOTE — ED Triage Notes (Signed)
Pt with eye redness to right eye with drainage. Pt states she has had a lens replacement years ago. Hx of detached retina. Blurry vision to eye.  Pt with an appt at [facility] on 1/19

## 2022-02-12 MED ORDER — PREDNISOLONE ACETATE 1 % OP SUSP: 1.0000 [drp] | Freq: Four times a day (QID) | OPHTHALMIC | 3 refills | Status: AC

## 2022-02-12 NOTE — Discharge Instructions (Addendum)
Resume taking your prednisolone eyedrops as before.  This prescription has been refilled this evening.  I strongly advise you to call your ophthalmologist at [facility] in the morning to see if they can follow you up in the next 1 to 2 days.  If not, call Dr. Manuella Ghazi and see if he can get you in.  Return to the emergency department if your symptoms significantly worsen or change.

## 2022-02-12 NOTE — ED Provider Notes (Signed)
[facility] EMERGENCY DEPARTMENT Provider Note   CSN: [id number] Arrival date & time: 02/11/22  2250     History  Chief Complaint  Patient presents with   Eye Pain    [name] is a 52 y.o. female.  Patient is a 52 year old female with history of prior lens replacement in the right eye.  This was performed several years ago.  Patient presenting today with complaints of drainage from the right eye and pain starting yesterday.  This began in the absence of any injury or trauma.  She denies to me she is having any visual disturbances or seeing spots.  She denies any ill contacts.  She takes prednisone drops, however ran out of these several days ago.  She has seen Dr. Brigitte Pulse in the past who referred her to [facility] due to the complexity of her case.  She has an appointment coming up there on the 19th of this month.  The history is provided by the patient.       Home Medications Prior to Admission medications   Medication Sig Start Date End Date Taking? Authorizing Provider  acetaminophen (TYLENOL) 500 MG tablet Take 500 mg by mouth every 6 (six) hours as needed.    [provider]  hydrochlorothiazide (HYDRODIURIL) 25 MG tablet Take 25 mg by mouth daily.    [provider]  lisinopril (PRINIVIL,ZESTRIL) 10 MG tablet Take 10 mg by mouth daily.    [provider]  lovastatin (MEVACOR) 20 MG tablet Take 20 mg by mouth at bedtime.    [provider]  ofloxacin (OCUFLOX) 0.3 % ophthalmic solution Place 1 drop into both eyes every 4 (four) hours. 10/21/21   Noemi Chapel, MD  prednisoLONE acetate (PRED FORTE) 1 % ophthalmic suspension Place 1 drop into both eyes 4 (four) times daily. 10/21/21   Noemi Chapel, MD      Allergies    Patient has no known allergies.    Review of Systems   Review of Systems  All other systems reviewed and are negative.   Physical Exam Updated Vital Signs BP (!) 164/112 (BP Location: Right Arm)   Pulse (!) 114    Temp 98.1 F (36.7 C) (Oral)   Resp 18   Ht 5\' 8"  (1.727 m)   Wt 83.5 kg   LMP 06/10/2020   SpO2 97%   BMI 27.98 kg/m  Physical Exam Vitals and nursing note reviewed.  Constitutional:      General: She is not in acute distress.    Appearance: Normal appearance. She is not ill-appearing.  HENT:     Head: Normocephalic and atraumatic.  Eyes:     Comments: The left pupil is round and reactive, however the right pupil is elliptical in appearance.  Patient uncertain as to whether this is chronic or new.  Remainder of eye exam is somewhat limited, but I see no obvious abnormality on funduscopic exam.  Anterior chamber is clear.  There is slight injection of the conjunctiva, but no purulent drainage.  Pulmonary:     Effort: Pulmonary effort is normal.  Neurological:     Mental Status: She is alert and oriented to person, place, and time.     ED Results / Procedures / Treatments   Labs (all labs ordered are listed, but only abnormal results are displayed) Labs Reviewed - No data to display  EKG None  Radiology No results found.  Procedures Procedures    Medications Ordered in ED Medications - No  data to display  ED Course/ Medical Decision Making/ A&P  Patient with history of prior lens replacement presenting with right eye pain and drainage as described in the HPI.  She has been out of her prednisone drops for the past several days.  The only abnormality is that she has an elliptical pupil, whether this is new or old, I am uncertain.  I feel as though patient should be followed up promptly by ophthalmology but nothing today seems emergent.  She has an appointment on the 19th in [place] and I have advised her to see if she could move this appointment up.  I will also advise her to possibly contact Dr. Brigitte Pulse who is seen her in the past if [facility] is unable to get her in sooner.  Final Clinical Impression(s) / ED Diagnoses Final diagnoses:  None    Rx / DC  Orders ED Discharge Orders     None         Veryl Speak, MD 02/12/22 0107

## 2023-02-02 ENCOUNTER — Encounter (HOSPITAL_COMMUNITY): Payer: Self-pay

## 2023-02-02 ENCOUNTER — Emergency Department (HOSPITAL_COMMUNITY)
Admission: EM | Admit: 2023-02-02 | Discharge: 2023-02-02 | Disposition: A | Discharge status: Home / Self Care | Payer: Self-pay

## 2023-02-02 ENCOUNTER — Emergency Department (HOSPITAL_COMMUNITY): Payer: Self-pay

## 2023-02-02 ENCOUNTER — Other Ambulatory Visit: Payer: Self-pay

## 2023-02-02 DIAGNOSIS — R209 Unspecified disturbances of skin sensation: Secondary | ICD-10-CM | POA: Insufficient documentation

## 2023-02-02 DIAGNOSIS — R42 Dizziness and giddiness: Secondary | ICD-10-CM | POA: Insufficient documentation

## 2023-02-02 DIAGNOSIS — R471 Dysarthria and anarthria: Secondary | ICD-10-CM | POA: Insufficient documentation

## 2023-02-02 DIAGNOSIS — R519 Headache, unspecified: Secondary | ICD-10-CM | POA: Insufficient documentation

## 2023-02-02 DIAGNOSIS — Z79899 Other long term (current) drug therapy: Secondary | ICD-10-CM | POA: Insufficient documentation

## 2023-02-02 LAB — DIFFERENTIAL
Abs Immature Granulocytes: 0.06 10*3/uL (ref 0.00–0.07)
Basophils Absolute: 0 10*3/uL (ref 0.0–0.1)
Basophils Relative: 0 %
Eosinophils Absolute: 0 10*3/uL (ref 0.0–0.5)
Eosinophils Relative: 1 %
Immature Granulocytes: 1 %
Lymphocytes Relative: 19 %
Lymphs Abs: 1.5 10*3/uL (ref 0.7–4.0)
Monocytes Absolute: 0.5 10*3/uL (ref 0.1–1.0)
Monocytes Relative: 6 %
Neutro Abs: 6 10*3/uL (ref 1.7–7.7)
Neutrophils Relative %: 73 %

## 2023-02-02 LAB — RAPID URINE DRUG SCREEN, HOSP PERFORMED
Amphetamines: NOT DETECTED
Barbiturates: NOT DETECTED
Benzodiazepines: NOT DETECTED
Cocaine: NOT DETECTED
Opiates: NOT DETECTED
Tetrahydrocannabinol: NOT DETECTED

## 2023-02-02 LAB — URINALYSIS, ROUTINE W REFLEX MICROSCOPIC
Bilirubin Urine: NEGATIVE
Glucose, UA: NEGATIVE mg/dL
Hgb urine dipstick: NEGATIVE
Ketones, ur: NEGATIVE mg/dL
Leukocytes,Ua: NEGATIVE
Nitrite: NEGATIVE
Specific Gravity, Urine: <1.005 — ABNORMAL LOW (ref 1.005–1.030)
pH: 7.5 (ref 5.0–8.0)

## 2023-02-02 LAB — COMPREHENSIVE METABOLIC PANEL
ALT: 26 U/L (ref 0–44)
AST: 26 U/L (ref 15–41)
Albumin: 3.8 g/dL (ref 3.5–5.0)
Alkaline Phosphatase: 59 U/L (ref 38–126)
Anion gap: 13 (ref 5–15)
BUN: 13 mg/dL (ref 6–20)
CO2: 22 mmol/L (ref 22–32)
Calcium: 9.5 mg/dL (ref 8.9–10.3)
Chloride: 101 mmol/L (ref 98–111)
Creatinine, Ser: 0.85 mg/dL (ref 0.44–1.00)
GFR, Estimated: >60 mL/min (ref >60)
Glucose, Bld: 161 mg/dL — ABNORMAL HIGH (ref 70–99)
Potassium: 3.6 mmol/L (ref 3.5–5.1)
Sodium: 136 mmol/L (ref 135–145)
Total Bilirubin: 0.9 mg/dL (ref 0.0–1.2)
Total Protein: 7.7 g/dL (ref 6.5–8.1)

## 2023-02-02 LAB — URINALYSIS, MICROSCOPIC (REFLEX): Bacteria, UA: NONE SEEN

## 2023-02-02 LAB — CBC
HCT: 42.4 % (ref 36.0–46.0)
Hemoglobin: 14.3 g/dL (ref 12.0–15.0)
MCH: 30.4 pg (ref 26.0–34.0)
MCHC: 33.7 g/dL (ref 30.0–36.0)
MCV: 90.2 fL (ref 80.0–100.0)
Platelets: 258 10*3/uL (ref 150–400)
RBC: 4.7 MIL/uL (ref 3.87–5.11)
RDW: 12.4 % (ref 11.5–15.5)
WBC: 8.2 10*3/uL (ref 4.0–10.5)
nRBC: 0 % (ref 0.0–0.2)

## 2023-02-02 LAB — CBG MONITORING, ED: Glucose-Capillary: 173 mg/dL — ABNORMAL HIGH (ref 70–99)

## 2023-02-02 LAB — ETHANOL: Alcohol, Ethyl (B): <10 mg/dL (ref <10)

## 2023-02-02 LAB — PROTIME-INR
INR: 1 (ref 0.8–1.2)
Prothrombin Time: 13.4 s (ref 11.4–15.2)

## 2023-02-02 LAB — APTT: aPTT: 26 s (ref 24–36)

## 2023-02-02 MED ORDER — MECLIZINE HCL 25 MG PO TABS: 25.0000 mg | ORAL_TABLET | Freq: Three times a day (TID) | ORAL | 0 refills | Status: AC | PRN

## 2023-02-02 MED ORDER — MECLIZINE HCL 12.5 MG PO TABS
25.0000 mg | ORAL_TABLET | Freq: Once | ORAL | Status: AC
  Administered 2023-02-02: 25 mg via ORAL
  Filled 2023-02-02: qty 2

## 2023-02-02 MED ORDER — ONDANSETRON 4 MG PO TBDP
4.0000 mg | ORAL_TABLET | Freq: Once | ORAL | Status: AC
  Administered 2023-02-02: 4 mg via ORAL
  Filled 2023-02-02: qty 1

## 2023-02-02 MED ORDER — KETOROLAC TROMETHAMINE 15 MG/ML IJ SOLN
15.0000 mg | Freq: Once | INTRAMUSCULAR | Status: AC
  Administered 2023-02-02: 15 mg via INTRAVENOUS
  Filled 2023-02-02: qty 1

## 2023-02-02 MED ORDER — IOHEXOL 350 MG/ML SOLN
75.0000 mL | Freq: Once | INTRAVENOUS | Status: AC | PRN
  Administered 2023-02-02: 75 mL via INTRAVENOUS

## 2023-02-02 MED ORDER — PROCHLORPERAZINE EDISYLATE 10 MG/2ML IJ SOLN
10.0000 mg | Freq: Once | INTRAMUSCULAR | Status: AC
  Administered 2023-02-02: 10 mg via INTRAVENOUS
  Filled 2023-02-02: qty 2

## 2023-02-02 MED ORDER — ACETAMINOPHEN 500 MG PO TABS
1000.0000 mg | ORAL_TABLET | Freq: Once | ORAL | Status: AC
  Administered 2023-02-02: 1000 mg via ORAL
  Filled 2023-02-02: qty 2

## 2023-02-02 MED ORDER — SODIUM CHLORIDE 0.9 % IV BOLUS
1000.0000 mL | Freq: Once | INTRAVENOUS | Status: AC
  Administered 2023-02-02: 1000 mL via INTRAVENOUS

## 2023-02-02 NOTE — ED Notes (Signed)
 Patient transported to MRI

## 2023-02-02 NOTE — ED Triage Notes (Signed)
Pt c/o left side facial numbness and left leg numbness around lunch time yesterday 02/01/23. Per pt feeling more weak than usual. Pt states headache for 2 days and waking up with dizziness this morning. EDP at bedside during triage.

## 2023-02-02 NOTE — ED Notes (Signed)
Pt passed stroke swallow screen  

## 2023-02-02 NOTE — ED Provider Notes (Signed)
 [place] EMERGENCY DEPARTMENT AT [facility] Provider Note   CSN: [id number] Arrival date & time: 02/02/23  9255     History  Chief Complaint  Patient presents with   Dizziness    [name] is a 52 y.o. female.  This is a 52 year old female presenting emergency department for headache, vertigo started yesterday.  Reported some left facial numbness started yesterday afternoon as well.  This morning she stated that she had some dysarthria which concerned her.  Reports that she is quite dizzy.  No chest pain no shortness of breath.   Dizziness      Home Medications Prior to Admission medications   Medication Sig Start Date End Date Taking? Authorizing Provider  meclizine  (ANTIVERT ) 25 MG tablet Take 1 tablet (25 mg total) by mouth 3 (three) times daily as needed for up to 7 days for dizziness. 02/02/23 02/09/23 Yes Warren Lindahl, Caron PARAS, DO  acetaminophen  (TYLENOL ) 500 MG tablet Take 500 mg by mouth every 6 (six) hours as needed.    [provider]  hydrochlorothiazide (HYDRODIURIL) 25 MG tablet Take 25 mg by mouth daily.    [provider]  lisinopril (PRINIVIL,ZESTRIL) 10 MG tablet Take 10 mg by mouth daily.    [provider]  lovastatin (MEVACOR) 20 MG tablet Take 20 mg by mouth at bedtime.    [provider]  ofloxacin  (OCUFLOX ) 0.3 % ophthalmic solution Place 1 drop into both eyes every 4 (four) hours. 10/21/21   Cleotilde Rogue, MD  prednisoLONE  acetate (PRED FORTE ) 1 % ophthalmic suspension Place 1 drop into both eyes 4 (four) times daily. 02/12/22   Geroldine Berg, MD      Allergies    Patient has no known allergies.    Review of Systems   Review of Systems  Neurological:  Positive for dizziness.    Physical Exam Updated Vital Signs BP 130/77   Pulse 66   Temp 98 F (36.7 C)   Resp 19   Ht 5' 8 (1.727 m)   Wt 83.5 kg   LMP 06/10/2020   SpO2 93%   BMI 27.99 kg/m  Physical Exam Vitals reviewed.  Constitutional:       General: She is not in acute distress.    Appearance: She is not toxic-appearing.  HENT:     Head: Normocephalic.     Nose: Nose normal.     Mouth/Throat:     Mouth: Mucous membranes are moist.  Eyes:     Conjunctiva/sclera: Conjunctivae normal.  Cardiovascular:     Rate and Rhythm: Normal rate and regular rhythm.  Pulmonary:     Effort: Pulmonary effort is normal.     Breath sounds: Normal breath sounds.  Abdominal:     General: Abdomen is flat. There is no distension.     Tenderness: There is no abdominal tenderness. There is no guarding or rebound.  Skin:    General: Skin is warm and dry.     Capillary Refill: Capillary refill takes less than 2 seconds.  Neurological:     Mental Status: She is alert.     Comments: Patient cranial nerves intact.  No gross motor deficits.  No facial droop.  No dysarthria.  Normal finger-nose.  Does complain of some subjective left facial paresthesia.  Psychiatric:        Mood and Affect: Mood normal.        Behavior: Behavior normal.     ED Results / Procedures / Treatments  Labs (all labs ordered are listed, but only abnormal results are displayed) Labs Reviewed  URINALYSIS, ROUTINE W REFLEX MICROSCOPIC - Abnormal; Notable for the following components:      Result Value   Specific Gravity, Urine <1.005 (*)    Protein, ur TRACE (*)    All other components within normal limits  COMPREHENSIVE METABOLIC PANEL - Abnormal; Notable for the following components:   Glucose, Bld 161 (*)    All other components within normal limits  CBG MONITORING, ED - Abnormal; Notable for the following components:   Glucose-Capillary 173 (*)    All other components within normal limits  RAPID URINE DRUG SCREEN, HOSP PERFORMED  PROTIME-INR  APTT  CBC  DIFFERENTIAL  ETHANOL  URINALYSIS, MICROSCOPIC (REFLEX)    EKG EKG Interpretation Date/Time:  Tuesday February 02 2023 07:57:07 EST Ventricular Rate:  75 PR Interval:  139 QRS Duration:  98 QT  Interval:  428 QTC Calculation: 479 R Axis:   75  Text Interpretation: Sinus rhythm Low voltage, precordial leads Confirmed by Neysa Clap [phone]) on 02/02/2023 12:52:46 PM  Radiology CT ANGIO HEAD NECK W WO CM Result Date: 02/02/2023 CLINICAL DATA:  Stroke, follow-up. Left-sided facial numbness and left leg numbness. Headache and dizziness. EXAM: CT ANGIOGRAPHY HEAD AND NECK WITH AND WITHOUT CONTRAST TECHNIQUE: Multidetector CT imaging of the head and neck was performed using the standard protocol during bolus administration of intravenous contrast. Multiplanar CT image reconstructions and MIPs were obtained to evaluate the vascular anatomy. Carotid stenosis measurements (when applicable) are obtained utilizing NASCET criteria, using the distal internal carotid diameter as the denominator. RADIATION DOSE REDUCTION: This exam was performed according to the departmental dose-optimization program which includes automated exposure control, adjustment of the mA and/or kV according to patient size and/or use of iterative reconstruction technique. CONTRAST:  75mL OMNIPAQUE  IOHEXOL  350 MG/ML SOLN COMPARISON:  MRI brain 02/02/2023. FINDINGS: CT HEAD FINDINGS Brain: No acute intracranial hemorrhage. Gray-white differentiation is preserved. No hydrocephalus or extra-axial collection. Unchanged 9 mm pineal cyst (No follow-up imaging is recommended). No mass effect or midline shift. Vascular: No hyperdense vessel or unexpected calcification. Skull: No calvarial fracture or suspicious bone lesion. Skull base is unremarkable. Sinuses/Orbits: No acute finding. Other: None. Review of the MIP images confirms the above findings CTA NECK FINDINGS Aortic arch: Two-vessel arch configuration with common origin of the right brachiocephalic and left common carotid arteries. Arch vessel origins are patent. Right carotid system: No evidence of dissection, stenosis (50% or greater), or occlusion. Left carotid system: No evidence of  dissection, stenosis (50% or greater), or occlusion. Vertebral arteries: Codominant. No evidence of dissection, stenosis (50% or greater), or occlusion. Skeleton: Mild cervical spondylosis without high-grade spinal canal stenosis. Other neck: Unremarkable. Upper chest: Unremarkable. Review of the MIP images confirms the above findings CTA HEAD FINDINGS Anterior circulation: Intracranial ICAs are patent without stenosis or aneurysm. The proximal ACAs and MCAs are patent without stenosis or aneurysm. Distal branches are symmetric. Posterior circulation: Normal basilar artery. The SCAs, AICAs and PICAs are patent proximally. The PCAs are patent proximally without stenosis or aneurysm. Distal branches are symmetric. Venous sinuses: As permitted by contrast timing, patent. Anatomic variants: Persistent fetal origin of the right PCA with hypoplastic right P1 segment. Absent right A1 segment. Azygous A2 segment. Review of the MIP images confirms the above findings IMPRESSION: 1. No acute intracranial abnormality. 2. No large vessel occlusion, hemodynamically significant stenosis, or aneurysm in the head or neck. Electronically Signed   By: Ryan  Wiggins M.D.   On: 02/02/2023 10:50   MR BRAIN WO CONTRAST Result Date: 02/02/2023 CLINICAL DATA:  Provided history: Stroke, follow-up. EXAM: MRI HEAD WITHOUT CONTRAST TECHNIQUE: Multiplanar, multiecho pulse sequences of the brain and surrounding structures were obtained without intravenous contrast. COMPARISON:  Head CT 02/02/2006. FINDINGS: Brain: No age advanced or lobar predominant parenchymal atrophy. Mild multifocal T2 FLAIR hyperintense signal abnormality within the cerebral white matter, nonspecific but most often secondary to chronic small vessel ischemia. 3 mm focus of susceptibility-weighted signal loss within the left temporal lobe, which may reflect a chronic microhemorrhage or calcification (series 17, image 24). 9 mm pineal cyst. No cortical encephalomalacia is  identified. There is no acute infarct. No evidence of an intracranial mass. No extra-axial fluid collection. No midline shift. Vascular: Maintained flow voids within the proximal large arterial vessels. Skull and upper cervical spine: No focal worrisome marrow lesion. Incompletely assessed cervical spondylosis. Sinuses/Orbits: No mass or acute finding within the imaged orbits. Prior bilateral ocular lens replacement. No significant paranasal sinus disease. Other: Trace fluid within the left mastoid air cells. IMPRESSION: 1. No evidence of an acute intracranial abnormality. 2. Mild multifocal T2 FLAIR hyperintense signal abnormality within the cerebral white matter, nonspecific but most often secondary to chronic small vessel ischemia. 3. 3 mm focus of susceptibility-weighted signal loss within the left temporal lobe, which may reflect a chronic microhemorrhage or calcification. 4. 9 mm pineal cyst. Electronically Signed   By: Rockey Childs D.O.   On: 02/02/2023 10:20    Procedures Procedures    Medications Ordered in ED Medications  iohexol  (OMNIPAQUE ) 350 MG/ML injection 75 mL (75 mLs Intravenous Contrast Given 02/02/23 0955)  ondansetron  (ZOFRAN -ODT) disintegrating tablet 4 mg (4 mg Oral Given 02/02/23 1138)  ketorolac  (TORADOL ) 15 MG/ML injection 15 mg (15 mg Intravenous Given 02/02/23 1137)  prochlorperazine  (COMPAZINE ) injection 10 mg (10 mg Intravenous Given 02/02/23 1137)  sodium chloride  0.9 % bolus 1,000 mL (0 mLs Intravenous Stopped 02/02/23 1200)  acetaminophen  (TYLENOL ) tablet 1,000 mg (1,000 mg Oral Given 02/02/23 1138)  meclizine  (ANTIVERT ) tablet 25 mg (25 mg Oral Given 02/02/23 1351)    ED Course/ Medical Decision Making/ A&P Clinical Course as of 02/02/23 1615  Tue Feb 02, 2023  1115 Case discussed with neurology, Dr. Matthews, given patient's history and imaging does not think consistent with CVA.  [TY]  1450 Patient is feeling improved after meclizine .  States she still has some  vertigo, but able to ambulate.  Offered admission for further evaluation and treatment.  Patient declined and would like to go home.  Given her negative workup today negative imaging feel this is reasonable.  Patient will be given neurology follow-up. [TY]    Clinical Course User Index [TY] Neysa Caron PARAS, DO                                 Medical Decision Making 52 year old female present emergency department with headache, vertigo and paresthesia to face.  She is afebrile mildly hypertensive.  Maintaining ox saturation on room air.  On physical exam no gross motor deficits, does have some paresthesia to the face.  Outside of any acute stroke intervention as her symptoms started yesterday.  Case discussed with neurology.  CTA and MRI brain without acute stroke.  Patient treated with migraine cocktail and meclizine  with improvement of her symptoms.  See ED course for further MDM disposition.  Amount and/or Complexity of Data  Reviewed Labs: ordered. Radiology: ordered. ECG/medicine tests: ordered.  Risk OTC drugs. Prescription drug management.          Final Clinical Impression(s) / ED Diagnoses Final diagnoses:  Vertigo    Rx / DC Orders ED Discharge Orders          Ordered    meclizine  (ANTIVERT ) 25 MG tablet  3 times daily PRN        02/02/23 1452              Neysa Caron PARAS, DO 02/02/23 1615

## 2023-02-02 NOTE — Discharge Instructions (Addendum)
May take over-the-counter Tylenol alternating with ibuprofen for your headaches.  You may use the meclizine for vertigo.  Please follow-up with your primary doctor.  You may also call and schedule an appointment with neurology as well.
